# Patient Record
Sex: Female | Born: 1980 | Hispanic: No | Marital: Married | Smoking: Never smoker
Health system: Southern US, Community
[De-identification: ages and names within clinical notes are randomized; demographics above are authoritative.]

## PROBLEM LIST (undated history)

## (undated) DIAGNOSIS — F32A Depression, unspecified: Secondary | ICD-10-CM

## (undated) DIAGNOSIS — M1990 Unspecified osteoarthritis, unspecified site: Secondary | ICD-10-CM

## (undated) DIAGNOSIS — D649 Anemia, unspecified: Secondary | ICD-10-CM

## (undated) DIAGNOSIS — R519 Headache, unspecified: Secondary | ICD-10-CM

## (undated) DIAGNOSIS — E039 Hypothyroidism, unspecified: Secondary | ICD-10-CM

## (undated) DIAGNOSIS — K589 Irritable bowel syndrome without diarrhea: Secondary | ICD-10-CM

## (undated) DIAGNOSIS — F329 Major depressive disorder, single episode, unspecified: Secondary | ICD-10-CM

## (undated) DIAGNOSIS — R7989 Other specified abnormal findings of blood chemistry: Secondary | ICD-10-CM

## (undated) DIAGNOSIS — F419 Anxiety disorder, unspecified: Secondary | ICD-10-CM

## (undated) DIAGNOSIS — R945 Abnormal results of liver function studies: Secondary | ICD-10-CM

## (undated) DIAGNOSIS — G2581 Restless legs syndrome: Secondary | ICD-10-CM

## (undated) DIAGNOSIS — R1013 Epigastric pain: Secondary | ICD-10-CM

## (undated) DIAGNOSIS — N200 Calculus of kidney: Secondary | ICD-10-CM

## (undated) DIAGNOSIS — D219 Benign neoplasm of connective and other soft tissue, unspecified: Secondary | ICD-10-CM

## (undated) HISTORY — DX: Abnormal results of liver function studies: R94.5

## (undated) HISTORY — DX: Anxiety disorder, unspecified: F41.9

## (undated) HISTORY — DX: Benign neoplasm of connective and other soft tissue, unspecified: D21.9

## (undated) HISTORY — PX: FEMORAL HERNIA REPAIR: SUR1179

## (undated) HISTORY — DX: Major depressive disorder, single episode, unspecified: F32.9

## (undated) HISTORY — DX: Other specified abnormal findings of blood chemistry: R79.89

## (undated) HISTORY — DX: Restless legs syndrome: G25.81

## (undated) HISTORY — DX: Depression, unspecified: F32.A

## (undated) HISTORY — DX: Anemia, unspecified: D64.9

## (undated) HISTORY — DX: Headache, unspecified: R51.9

## (undated) HISTORY — DX: Unspecified osteoarthritis, unspecified site: M19.90

---

## 1998-11-01 ENCOUNTER — Emergency Department (HOSPITAL_COMMUNITY): Admission: EM | Admit: 1998-11-01 | Discharge: 1998-11-01 | Payer: Self-pay

## 1999-02-10 ENCOUNTER — Inpatient Hospital Stay (HOSPITAL_COMMUNITY): Admission: EM | Admit: 1999-02-10 | Discharge: 1999-02-12 | Payer: Self-pay | Admitting: *Deleted

## 2006-05-08 ENCOUNTER — Emergency Department (HOSPITAL_COMMUNITY): Admission: EM | Admit: 2006-05-08 | Discharge: 2006-05-08 | Payer: Self-pay | Admitting: Emergency Medicine

## 2007-04-18 ENCOUNTER — Emergency Department (HOSPITAL_COMMUNITY): Admission: EM | Admit: 2007-04-18 | Discharge: 2007-04-18 | Payer: Self-pay | Admitting: Emergency Medicine

## 2007-05-08 ENCOUNTER — Encounter: Admission: RE | Admit: 2007-05-08 | Discharge: 2007-05-08 | Payer: Self-pay | Admitting: Surgery

## 2007-09-13 ENCOUNTER — Ambulatory Visit (HOSPITAL_COMMUNITY): Admission: RE | Admit: 2007-09-13 | Discharge: 2007-09-13 | Payer: Self-pay | Admitting: Obstetrics and Gynecology

## 2008-02-01 HISTORY — PX: DIAGNOSTIC LAPAROSCOPY: SUR761

## 2008-06-17 ENCOUNTER — Emergency Department (HOSPITAL_COMMUNITY): Admission: EM | Admit: 2008-06-17 | Discharge: 2008-06-17 | Payer: Self-pay | Admitting: Emergency Medicine

## 2009-05-08 ENCOUNTER — Encounter: Admission: RE | Admit: 2009-05-08 | Discharge: 2009-05-08 | Payer: Self-pay | Admitting: Family Medicine

## 2009-10-12 ENCOUNTER — Encounter: Admission: RE | Admit: 2009-10-12 | Discharge: 2009-10-12 | Payer: Self-pay | Admitting: Family Medicine

## 2010-02-21 ENCOUNTER — Encounter: Payer: Self-pay | Admitting: Family Medicine

## 2010-06-15 NOTE — Op Note (Signed)
NAMESHALAYA, SWAILES NO.:  [id number]   MEDICAL RECORD NO.:  [id number]          PATIENT TYPE:  AMB   LOCATION:  SDC                           FACILITY:  [facility]   PHYSICIAN:  Leonie Man, M.D.   DATE OF BIRTH:  11-15-1980   DATE OF PROCEDURE:  09/13/2007  DATE OF DISCHARGE:  09/13/2007                               OPERATIVE REPORT   PREOPERATIVE DIAGNOSIS:  Right groin mass.   POSTOPERATIVE DIAGNOSIS:  Right femoral hernia.   PROCEDURE:  Right femoral herniorrhaphy with mesh.   SURGEON:  Leonie Man, M.D.   ASSISTANT:  Dois Davenport A. Rivard, M.D.   ANESTHESIA:  General.   INDICATIONS FOR PROCEDURE:  The patient is a 30 year old female referred  originally by Dr. Estanislado Pandy for a right-sided groin mass, which waxed and  waned in size with some pain.  CT scan showed this to be approximately a  3.8-cm mass extending into the femoral canal.  The patient comes to the  operating room now for possible diagnostic and therapeutic laparoscopy  for grade 4 endometriosis as well as for right-sided groin exploration.   The patient understands the risks and the benefits of the surgery and  gives her consent for surgery.   This was a surgical team approach with Dr. Estanislado Pandy.  Dr. Estanislado Pandy proceeded  first to do the laparoscopy and some adhesiolysis of the patient's  endometrial adhesions, and we took time to look at both the inguinal  canal, which appeared to be normal, and the region up this canal, there  was in fact a mass located in the preperitoneal area.  I then approached  the groin mass by making a tranverse incision in the lower groin crease  deepening this through the skin and subcutaneous tissue and dissecting  down to the mass.  As it turns out, the mass was in deed a femoral  hernia, which was dissected free and delivered up into the wound in its  entirety.  The hernial sac was then suture ligated at its base,  amputated, and discarded.  The hernial defect in the  femoral ring was  then repaired with a medium mesh plug of Polypropylene and was sutured  into the pecten pubis at the inguinal ligament and with the pubis with  interrupted 2-0 Vicryl sutures.  The repair itself was inspected from  within the abdomen by laparoscopy and noted to be intact.  There was no  mesh protruding into the peritoneal cavity.  The subcutaneous tissues  were then closed after the sponge and instrument counts were verified.  This was done with 3-0 Vicryl sutures.  The skin was then closed  with an intradermal suture of 4-0 Monocryl and then reinforced with  Steri-Strips.  Sterile dressing was applied.  The anesthetic was  reversed.  The patient was then moved from the operating room to  recovery room having tolerated the procedure well.      Leonie Man, M.D.  Electronically Signed     PB/MEDQ  D:  10/01/2007  T:  10/02/2007  Job:  [id number]   cc:   Dois Davenport A.  Rivard, M.D.  Fax: [phone]

## 2010-06-15 NOTE — Op Note (Signed)
[name] [name], [name]               ACCOUNT NO.:  [id number]   MEDICAL RECORD NO.:  [id number]          PATIENT TYPE:  AMB   LOCATION:  SDC                           [facility]:  [facility]   PHYSICIAN:  Dois Davenport A. Rivard, M.D. DATE OF BIRTH:  06/07/1980   DATE OF PROCEDURE:  09/13/2007  DATE OF DISCHARGE:                               OPERATIVE REPORT   PREOPERATIVE DIAGNOSES:  1. Pelvic pain.  2. Right groin mass.   POSTOPERATIVE DIAGNOSES:  1. Grade 4 endometriosis.  2. Right femoral hernia.   PROCEDURE:  Diagnostic laparoscopy, Dr. Estanislado Pandy and right femoral  herniorrhaphy, Dr. Talmage Nap.   ASSISTANTS:  Dr. Talmage Nap and Dr. Estanislado Pandy.   ANESTHESIA:  General, Dr. Jean Rosenthal.   ESTIMATED BLOOD LOSS:  Minimal.   DESCRIPTION OF PROCEDURE:  After being informed of the planned procedure  with possible complications including bleeding, infection, injury to  bowel, bladder or ureters, need for laparotomy, informed consent was  obtained.  The patient was taken to OR #7, given general anesthesia with  endotracheal intubation without any complication.  She was placed in a  lithotomy position, prepped and draped in a sterile fashion, and a Foley  catheter was inserted in her bladder.  A speculum was inserted anterior  lip of the cervix, was grasped with a tenaculum forceps, and a  intrauterine manipulator was placed without difficulty.  Please note  that the pelvic exam previously revealed an anteverted uterus with some  limited mobility and no mass was felt.   We infiltrate the umbilical area with 5 mL of Marcaine 0.25 and  performed a semi-elliptical incision which was brought down sharply to  the fascia.  The fascia was identified with Kocher forceps and incised  with Mayo scissors and peritoneum was entered bluntly.  A pursestring  suture of 0 Vicryl was placed on the fascia and a 10-mm Hasson trocar  was easily inserted, held in place with a pursestring suture.  This  allows easy insufflation of a  pneumoperitoneum with CO2 at a maximum  pressure of 15 mmHg.   Laparoscope was inserted.   A 5-mm suprapubic trocar was placed under direct visualization after  infiltrating with Marcaine 0.25.   Observation:  Anterior cul-de-sac was normal.  Uterus shows a small  fibroid measuring 1 cm at the fundus.  The posterior cul-de-sac appears  free of disease; but in the mid posterior body of the uterus, there are  dense grade 4 adhesions with endometriotic bluish lesions pulling the  posterior retroperitoneum to the fundus, immobilizing the latter.  The  left tube is tortuous and held in place through the left pelvic wall  with dense adhesions.  The left ovary was attached to the left pelvic  wall with dense grade 4 adhesions and upon attempt to mobilize a little  bit of chocolate discharge, appears compatible with endometrioma.  The  right tube and the right ovary are completely plicated to the right  pelvic wall with grade 4 adhesion, which also pulls on the right cornua  of the uterus.  The ureter was visualized on the right and appears  to be  diving into that adhesion process.  Initially, thought was that we could  try to free the fundus of the uterus and so a left lower quadrant trocar  was inserted.  A 5-mm trocar was inserted under direct visualization  after infiltrating with Marcaine 0.25 and using hot scissors, attempt to  perform lysis of adhesions very close to the uterine serosa was  unsuccessful and deemed too risky due to the fact that the patient had  not good prep bowel wise.  Although we do not see bowel in that process,  there is the right lateral aspect of the rectum which was pulled by the  process and that lysis of adhesions would risk bowel injury.   Decision was made to stop the lysis of adhesion there, cauterized the  few sites of oozing, irrigated the pelvis with warm saline and noted a  satisfactory hemostasis, and discontinued the laparoscopic attempt of  lysis  of adhesion at this time.   The patient will be offered preparation with Depo-Lupron as well as a  bowel preparation and should she wish to pursue surgical venue, we will  plan this under robotic approach for enhanced precision.   Dr. Talmage Nap now proceeds with his right femoral herniorrhaphy, which will  be dictated by him in which I assisted.   After the herniorrhaphy was completed then we had visualized  intraperitoneally its closure.  Trocars were removed after evacuating  the pneumoperitoneum.  The fascia of the umbilical incision was closed  with a previously placed pursestring suture of 0 Vicryl and skin of all  incision was closed with subcuticular suture of 3-0 Monocryl and  Dermabond.   Instruments and sponge count was complete x2.  Estimated blood loss is  minimal.  The procedure was very well tolerated by the patient who was  taken to recovery room in a well and stable condition.  No specimen from  the laparoscopy was sent.      Crist Fat Rivard, M.D.  Electronically Signed     SAR/MEDQ  D:  09/13/2007  T:  09/14/2007  Job:  [id number]

## 2010-06-18 DIAGNOSIS — F329 Major depressive disorder, single episode, unspecified: Secondary | ICD-10-CM

## 2010-07-30 DIAGNOSIS — F329 Major depressive disorder, single episode, unspecified: Secondary | ICD-10-CM

## 2010-10-25 LAB — DIFFERENTIAL
Eosinophils Relative: 2
Monocytes Relative: 6
Neutro Abs: 4.3
Neutrophils Relative %: 71

## 2010-10-25 LAB — URINALYSIS, ROUTINE W REFLEX MICROSCOPIC
Specific Gravity, Urine: 1.015
Urobilinogen, UA: 0.2
pH: 8

## 2010-10-25 LAB — CBC
HCT: 36.6
Hemoglobin: 12.4
MCHC: 34
MCV: 85.1
RBC: 4.3
WBC: 6

## 2010-10-29 LAB — PREGNANCY, URINE: Preg Test, Ur: NEGATIVE

## 2010-10-29 LAB — CBC
HCT: 36.7
MCHC: 33.3
MCV: 86.7
RBC: 4.23

## 2011-01-15 ENCOUNTER — Encounter (HOSPITAL_COMMUNITY): Payer: Self-pay | Admitting: *Deleted

## 2011-01-15 ENCOUNTER — Inpatient Hospital Stay (HOSPITAL_COMMUNITY): Payer: BC Managed Care – PPO

## 2011-01-15 ENCOUNTER — Inpatient Hospital Stay (HOSPITAL_COMMUNITY)
Admission: AD | Admit: 2011-01-15 | Discharge: 2011-01-15 | Disposition: A | Discharge status: Home / Self Care | Payer: BC Managed Care – PPO | Source: Ambulatory Visit | Attending: Gynecology | Admitting: Gynecology

## 2011-01-15 DIAGNOSIS — N809 Endometriosis, unspecified: Secondary | ICD-10-CM

## 2011-01-15 DIAGNOSIS — D259 Leiomyoma of uterus, unspecified: Secondary | ICD-10-CM | POA: Insufficient documentation

## 2011-01-15 DIAGNOSIS — R1030 Lower abdominal pain, unspecified: Secondary | ICD-10-CM

## 2011-01-15 DIAGNOSIS — R109 Unspecified abdominal pain: Secondary | ICD-10-CM | POA: Insufficient documentation

## 2011-01-15 LAB — WET PREP, GENITAL
Trich, Wet Prep: NONE SEEN
WBC, Wet Prep HPF POC: NONE SEEN
Yeast Wet Prep HPF POC: NONE SEEN

## 2011-01-15 LAB — URINE MICROSCOPIC-ADD ON

## 2011-01-15 LAB — URINALYSIS, ROUTINE W REFLEX MICROSCOPIC
Ketones, ur: NEGATIVE mg/dL
Nitrite: NEGATIVE
Protein, ur: 30 mg/dL — AB
Specific Gravity, Urine: 1.025 (ref 1.005–1.030)
Urobilinogen, UA: 0.2 mg/dL (ref 0.0–1.0)
pH: 5.5 (ref 5.0–8.0)

## 2011-01-15 MED ORDER — TRAMADOL HCL ER 100 MG PO TB24: 100.0000 mg | ORAL_TABLET | Freq: Every day | ORAL | Status: AC

## 2011-01-15 MED ORDER — KETOROLAC TROMETHAMINE 60 MG/2ML IM SOLN
60.0000 mg | Freq: Once | INTRAMUSCULAR | Status: AC
  Administered 2011-01-15: 60 mg via INTRAMUSCULAR
  Filled 2011-01-15: qty 2

## 2011-01-15 MED ORDER — IBUPROFEN 600 MG PO TABS: 600.0000 mg | ORAL_TABLET | Freq: Four times a day (QID) | ORAL | Status: AC | PRN

## 2011-01-15 NOTE — Progress Notes (Signed)
Pt states, " I had spotting for a couple of days and then started my period yesterday. I had some pain yesterday and I atook advil, but today it is worse, and it is in my left side of my abdomen  and down my legs and goes into my ribs.  I had a laparoscopic 2 yrs ago and was told that have endometriosis."

## 2011-01-15 NOTE — ED Provider Notes (Signed)
History     Chief Complaint  Patient presents with  . Abdominal Pain  . Vaginal Bleeding   HPI [name] [name] 30 y.o. Comes to [facility]U tonight with severe lower abdominal pain.  Saw Dr. Nicholas Lose in the office on 12-30-10.  Menses started 01-13-11 and she has had severe pain this time.  Taking Ibuprofen and Midol but has not helped pain today.   OB History    Grav Para Term Preterm Abortions TAB SAB Ect Mult Living   0               Past Medical History  Diagnosis Date  . Endometriosis     Past Surgical History  Procedure Date  . Laproscopic 2010    Family History  Problem Relation Age of Onset  . Anesthesia problems Neg Hx   . Hypotension Neg Hx   . Pseudochol deficiency Neg Hx   . Malignant hyperthermia Neg Hx     History  Substance Use Topics  . Smoking status: Not on file  . Smokeless tobacco: Not on file  . Alcohol Use:     Allergies:  Allergies  Allergen Reactions  . Penicillins Other (See Comments)    Childhood allergy;reaction unknown    Prescriptions prior to admission  Medication Sig Dispense Refill  . BuPROPion HCl (WELLBUTRIN PO) Take 1 tablet by mouth daily.        Marland Kitchen escitalopram (LEXAPRO) 10 MG tablet Take 10 mg by mouth daily.        Marland Kitchen ibuprofen (ADVIL,MOTRIN) 200 MG tablet Take 200 mg by mouth every 6 (six) hours as needed. For pain         Review of Systems  Gastrointestinal: Positive for abdominal pain. Negative for nausea and vomiting.   Physical Exam   Blood pressure 119/75, pulse 62, temperature 98.5 F (36.9 C), temperature source Oral, resp. rate 20, height 5' 4.5" (1.638 m), weight 130 lb 8 oz (59.194 kg), last menstrual period 01/13/2011.  Physical Exam  Nursing note and vitals reviewed. Constitutional: She is oriented to person, place, and time. She appears well-developed and well-nourished.       Client seems in severe pain.  Rubbing right leg repetitively as pain is radiating to right leg an to right ribs  HENT:  Head:  Normocephalic.  Eyes: EOM are normal.  Neck: Neck supple.  GI: Soft. There is tenderness. There is no rebound and no guarding.       Tender all across lower abdomen  Genitourinary:       Speculum exam: Vagina - Mod amount of bloody discharge, no odor Cervix - No contact bleeding Bimanual exam: Cervix closed Uterus tender, normal size Adnexa tender R>L, no masses bilaterally GC/Chlam, wet prep done Chaperone present for exam.  Musculoskeletal: Normal range of motion.  Neurological: She is alert and oriented to person, place, and time.  Skin: Skin is warm and dry.  Psychiatric: She has a normal mood and affect.    MAU Course  Procedures Toradol 60 mg IM given for pain and client is much more comfortable after ultrasound.  Last pain medication was 8 am on 01-14-11. Results for orders placed during the hospital encounter of 01/15/11 (from the past 24 hour(s))  URINALYSIS, ROUTINE W REFLEX MICROSCOPIC     Status: Abnormal   Collection Time   01/15/11  1:55 AM      Component Value Range   Color, Urine RED (*) YELLOW    APPearance CLOUDY (*) CLEAR  Specific Gravity, Urine 1.025  1.005 - 1.030    pH 5.5  5.0 - 8.0    Glucose, UA NEGATIVE  NEGATIVE (mg/dL)   Hgb urine dipstick LARGE (*) NEGATIVE    Bilirubin Urine NEGATIVE  NEGATIVE    Ketones, ur NEGATIVE  NEGATIVE (mg/dL)   Protein, ur 30 (*) NEGATIVE (mg/dL)   Urobilinogen, UA 0.2  0.0 - 1.0 (mg/dL)   Nitrite NEGATIVE  NEGATIVE    Leukocytes, UA TRACE (*) NEGATIVE   URINE MICROSCOPIC-ADD ON     Status: Normal   Collection Time   01/15/11  1:55 AM      Component Value Range   Squamous Epithelial / LPF RARE  RARE    WBC, UA 0-2  <3 (WBC/hpf)   RBC / HPF TOO NUMEROUS TO COUNT  <3 (RBC/hpf)   Bacteria, UA RARE  RARE   POCT PREGNANCY, URINE     Status: Normal   Collection Time   01/15/11  2:42 AM      Component Value Range   Preg Test, Ur NEGATIVE    WET PREP, GENITAL     Status: Abnormal   Collection Time   01/15/11   3:04 AM      Component Value Range   Yeast, Wet Prep NONE SEEN  NONE SEEN    Trich, Wet Prep NONE SEEN  NONE SEEN    Clue Cells, Wet Prep FEW (*) NONE SEEN    WBC, Wet Prep HPF POC NONE SEEN  NONE SEEN     MDM Clinical Data: Pelvic / abdominal pain, history of endometriosis.  TRANSABDOMINAL AND TRANSVAGINAL ULTRASOUND OF PELVIS  Technique: Both transabdominal and transvaginal ultrasound  examinations of the pelvis were performed. Transabdominal technique  was performed for global imaging of the pelvis including uterus,  ovaries, adnexal regions, and pelvic cul-de-sac.  Comparison: None.  It was necessary to proceed with endovaginal exam following the  transabdominal exam to visualize the endometrium and adnexa.  Findings:  Uterus: Numerous fibroids are present, measuring up to 2.0 x 1.5  cm. The uterus measures 9.4 x 5.2 x 7.4 cm. The fibroids are  predominately intramural. There is a subserosal fibroid as well.  No submucosal fibroid identified.  Endometrium: Normal in thickness and appearance, measuring 8 mm.  Right ovary: Normal appearance/no adnexal mass. Measures 2.8 x  1.3 x 1.9 cm.  Left ovary: Normal appearance/no adnexal mass. Measures 3.1 x 1.8  x 1.9 cm.  Other findings: No free fluid  IMPRESSION:  Fibroid uterus. No acute abnormality identified.    Assessment and Plan  Uterine fibroids  Plan It will be important to take your pain medication on time if you are having cramping Follow up with dr. Nicholas Lose if your pain continues. Will prescribe PO ibuprofen and ultram for severe pain   [name] [name] 01/15/2011, 3:11 AM   [name] Bernheim, NP 01/15/11 [phone]

## 2011-01-18 LAB — GC/CHLAMYDIA PROBE AMP, GENITAL
Chlamydia, DNA Probe: NEGATIVE
GC Probe Amp, Genital: NEGATIVE

## 2011-03-15 ENCOUNTER — Encounter: Payer: Self-pay | Admitting: Internal Medicine

## 2011-03-29 ENCOUNTER — Other Ambulatory Visit: Payer: Self-pay | Admitting: Orthopedic Surgery

## 2011-03-29 DIAGNOSIS — M545 Low back pain, unspecified: Secondary | ICD-10-CM

## 2011-04-08 ENCOUNTER — Other Ambulatory Visit: Payer: BC Managed Care – PPO

## 2011-04-08 ENCOUNTER — Ambulatory Visit
Admission: RE | Admit: 2011-04-08 | Discharge: 2011-04-08 | Disposition: A | Discharge status: Home / Self Care | Payer: BC Managed Care – PPO | Source: Ambulatory Visit | Attending: Orthopedic Surgery | Admitting: Orthopedic Surgery

## 2011-04-08 DIAGNOSIS — M545 Low back pain, unspecified: Secondary | ICD-10-CM

## 2011-04-08 MED ORDER — IOHEXOL 300 MG/ML  SOLN
100.0000 mL | Freq: Once | INTRAMUSCULAR | Status: AC | PRN
  Administered 2011-04-08: 100 mL via INTRAVENOUS

## 2011-04-26 ENCOUNTER — Encounter: Payer: Self-pay | Admitting: *Deleted

## 2011-05-10 ENCOUNTER — Encounter: Payer: Self-pay | Admitting: Internal Medicine

## 2011-05-10 ENCOUNTER — Ambulatory Visit (INDEPENDENT_AMBULATORY_CARE_PROVIDER_SITE_OTHER): Payer: BC Managed Care – PPO | Admitting: Internal Medicine

## 2011-05-10 VITALS — BP 100/60 | HR 74 | Ht 66.0 in | Wt 125.0 lb

## 2011-05-10 DIAGNOSIS — N809 Endometriosis, unspecified: Secondary | ICD-10-CM

## 2011-05-10 DIAGNOSIS — R1031 Right lower quadrant pain: Secondary | ICD-10-CM

## 2011-05-10 MED ORDER — PEG-KCL-NACL-NASULF-NA ASC-C 100 G PO SOLR: 1.0000 | Freq: Once | ORAL | Status: DC

## 2011-05-10 NOTE — Progress Notes (Signed)
[name] [name] May 17, 1980 MRN [id number]    History of Present Illness:  This is a 31 year old female from [place] with a 4 year history of lower abdominal pain which occurs only during her periods once a month and lasts about 4 days. She had severe right lower quadrant abdominal pain yesterday and last night. There has been no association of the pain with bowel habits or with food. The pain is not positional. It sometimes extends to the right thigh. She has a bowel movement every other day. She has a history of endometriosis diagnosed on a laparoscopy in [place], [place] in 2009. Some of the endometriosis was ablated. She was on birth control pills for several years but discontinued it about 2 years ago. She has had a recent evaluation by Dr. Nicholas Lose with pelvic ultrasound which shows several intramural uterine fibroids. CT scan of the abdomen in February 2013 again showed uterine fibroids, normal hips and lumbosacral spine. An upper abdominal ultrasound in September 2011 was normal. She has a family history of colon cancer in her father who had a premalignant polyp at age 72 and died of colon cancer at age 33.   Past Medical History  Diagnosis Date  . Endometriosis   . Fibroids     uterine  . Elevated LFTs   . Depression   . Restless leg syndrome   . Anemia   . Anxiety    Past Surgical History  Procedure Date  . Diagnostic laparoscopy 2010  . Femoral hernia repair     right    reports that she has never smoked. She has never used smokeless tobacco. She reports that she does not drink alcohol or use illicit drugs. family history includes Colon cancer in her father and Colon polyps in her father.  There is no history of Anesthesia problems, and Hypotension, and Pseudochol deficiency, and Malignant hyperthermia, . Allergies  Allergen Reactions  . Penicillins Other (See Comments)    Childhood allergy;reaction unknown        Review of Systems: Denies nausea, vomiting, abdominal  distention, diarrhea or rectal bleeding  The remainder of the 10 point ROS is negative except as outlined in H&P   Physical Exam: General appearance  Well developed, in no distress. Eyes- non icteric. HEENT nontraumatic, normocephalic. Mouth no lesions, tongue papillated, no cheilosis. Neck supple without adenopathy, thyroid not enlarged, no carotid bruits, no JVD. Lungs Clear to auscultation bilaterally. Cor normal S1, normal S2, regular rhythm, no murmur,  quiet precordium. Abdomen: Soft relaxed with normal active bowel sounds. Minimal tenderness in right lower quadrant overlying cecum. No palpable mass. Straight leg raising is negative, sitting up and leaning down does not precipitate the pain. There is no evidence of inguinal hernia. Rectal: Small amount of solid Hemoccult-positive stool. Patient is on her menstrual cycle. Extremities no pedal edema. Skin no lesions. Neurological alert and oriented x 3. Psychological normal mood and affect.  Assessment and Plan:  Problem #1 Chronic right lower quadrant abdominal pain present only during her menses. I suspect recurrent endometriosis. Imaging studies have been negative. She had a prior laparoscopy in 2009. Birth control pills did not seem to help significantly. She has a strong family history of colorectal cancer in her father and therefore colonoscopy would be justified at this point to clear the possibility of a cecal lesion. We will schedule her for a colonoscopy and if it is negative, I would suggest she go back to Dr. Nicholas Lose to consider a repeat laparoscop or possibly hormonal manipulation.  I have discussed this plan with the patient.  05/10/2011 [name]

## 2011-05-10 NOTE — Patient Instructions (Addendum)
You have been scheduled for a colonoscopy with propofol. Please follow written instructions given to you at your visit today.  Please pick up your prep kit at the pharmacy within the next 1-3 days. CC: Dr Merri Brunette, Dr Beather Arbour

## 2011-07-06 ENCOUNTER — Encounter: Payer: Self-pay | Admitting: Internal Medicine

## 2011-07-06 ENCOUNTER — Ambulatory Visit (AMBULATORY_SURGERY_CENTER): Payer: BC Managed Care – PPO | Admitting: Internal Medicine

## 2011-07-06 VITALS — BP 104/65 | HR 50 | Temp 98.6°F | Resp 13 | Ht 66.0 in | Wt 125.0 lb

## 2011-07-06 DIAGNOSIS — R1031 Right lower quadrant pain: Secondary | ICD-10-CM

## 2011-07-06 DIAGNOSIS — Z1211 Encounter for screening for malignant neoplasm of colon: Secondary | ICD-10-CM

## 2011-07-06 DIAGNOSIS — Z800 Family history of malignant neoplasm of digestive organs: Secondary | ICD-10-CM

## 2011-07-06 DIAGNOSIS — G8929 Other chronic pain: Secondary | ICD-10-CM

## 2011-07-06 MED ORDER — SODIUM CHLORIDE 0.9 % IV SOLN: 500.0000 mL | INTRAVENOUS | Status: DC

## 2011-07-06 NOTE — Patient Instructions (Signed)
You may resume your prior medications today.  Please call if any questions or concerns.  Handout given to your care partner on a high fiber diet.  Per Dr. Juanda Chance follow up with Dr. Nicholas Lose for consideration of laparoscopic exam for endometriosis.      YOU HAD AN ENDOSCOPIC PROCEDURE TODAY AT THE [facility]: Refer to the procedure report that was given to you for any specific questions about what was found during the examination.  If the procedure report does not answer your questions, please call your gastroenterologist to clarify.  If you requested that your care partner not be given the details of your procedure findings, then the procedure report has been included in a sealed envelope for you to review at your convenience later.  YOU SHOULD EXPECT: Some feelings of bloating in the abdomen. Passage of more gas than usual.  Walking can help get rid of the air that was put into your GI tract during the procedure and reduce the bloating. If you had a lower endoscopy (such as a colonoscopy or flexible sigmoidoscopy) you may notice spotting of blood in your stool or on the toilet paper. If you underwent a bowel prep for your procedure, then you may not have a normal bowel movement for a few days.  DIET: Your first meal following the procedure should be a light meal and then it is ok to progress to your normal diet.  A half-sandwich or bowl of soup is an example of a good first meal.  Heavy or fried foods are harder to digest and may make you feel nauseous or bloated.  Likewise meals heavy in dairy and vegetables can cause extra gas to form and this can also increase the bloating.  Drink plenty of fluids but you should avoid alcoholic beverages for 24 hours.  ACTIVITY: Your care partner should take you home directly after the procedure.  You should plan to take it easy, moving slowly for the rest of the day.  You can resume normal activity the day after the procedure however you should NOT DRIVE  or use heavy machinery for 24 hours (because of the sedation medicines used during the test).    SYMPTOMS TO REPORT IMMEDIATELY: A gastroenterologist can be reached at any hour.  During normal business hours, 8:30 AM to 5:00 PM Monday through Friday, call [phone].  After hours and on weekends, please call the GI answering service at [phone] who will take a message and have the physician on call contact you.   Following lower endoscopy (colonoscopy or flexible sigmoidoscopy):  Excessive amounts of blood in the stool  Significant tenderness or worsening of abdominal pains  Swelling of the abdomen that is new, acute  Fever of 100F or higher    FOLLOW UP: If any biopsies were taken you will be contacted by phone or by letter within the next 1-3 weeks.  Call your gastroenterologist if you have not heard about the biopsies in 3 weeks.  Our staff will call the home number listed on your records the next business day following your procedure to check on you and address any questions or concerns that you may have at that time regarding the information given to you following your procedure. This is a courtesy call and so if there is no answer at the home number and we have not heard from you through the emergency physician on call, we will assume that you have returned to your regular daily activities without incident.  SIGNATURES/CONFIDENTIALITY: You and/or your care partner have signed paperwork which will be entered into your electronic medical record.  These signatures attest to the fact that that the information above on your After Visit Summary has been reviewed and is understood.  Full responsibility of the confidentiality of this discharge information lies with you and/or your care-partner.

## 2011-07-06 NOTE — Op Note (Signed)
[facility] [address] [place], [place]  [address]  COLONOSCOPY PROCEDURE REPORT  PATIENT:  [name] [name], [name] [name]  MR#:  [id number] BIRTHDATE:  08/21/1980, 30 yrs. old  GENDER:  female ENDOSCOPIST:  Hedwig Morton. Juanda Chance, MD REF. BY:  Merri Brunette, M.D.  Dr Beather Arbour PROCEDURE DATE:  07/06/2011 PROCEDURE:  Colonoscopy 19147 ASA CLASS:  Class I INDICATIONS:  Abdominal pain, family history of colon cancer RLQ abd. pain, hx of endometriosis, father with colon cancer age 68 MEDICATIONS:   MAC sedation, administered by CRNA, propofol (Diprivan) 200 mg  DESCRIPTION OF PROCEDURE:   After the risks and benefits and of the procedure were explained, informed consent was obtained. Digital rectal exam was performed and revealed no rectal masses. The LB [id number]-[id number] endoscope was introduced through the anus and advanced to the cecum, which was identified by both the appendix and ileocecal valve.  The quality of the prep was good, using MoviPrep.  The instrument was then slowly withdrawn as the colon was fully examined. <<PROCEDUREIMAGES>>  FINDINGS:  No polyps or cancers were seen (see image1, image2, image3, and image4).   Retroflexed views in the rectum revealed no abnormalities.    The scope was then withdrawn from the patient and the procedure completed.  COMPLICATIONS:  None ENDOSCOPIC IMPRESSION: 1) No polyps or cancers 2) Normal colonoscopy redundant tortuous colon, nothing to account for the RLQ abd. pain RECOMMENDATIONS: 1) High fiber diet. I suggest that she follows up with Dr Nicholas Lose for consideration of laparoscopic exam for endometriosis  REPEAT EXAM:  In 5 year(s) for.  ______________________________ Hedwig Morton. Juanda Chance, MD  CC:  n. eSIGNED:   Hedwig Morton. Ambar Raphael at 07/06/2011 08:45 AM  Dena Billet, [phone]

## 2011-07-06 NOTE — Progress Notes (Signed)
Patient did not experience any of the following events: a burn prior to discharge; a fall within the facility; wrong site/side/patient/procedure/implant event; or a hospital transfer or hospital admission upon discharge from the facility. (G8907) Patient did not have preoperative order for IV antibiotic SSI prophylaxis. (G8918)  

## 2011-07-06 NOTE — Progress Notes (Signed)
No complaints noted in the recovery room. maw 

## 2011-07-07 ENCOUNTER — Telehealth: Payer: Self-pay

## 2011-07-07 NOTE — Telephone Encounter (Signed)
  Follow up Call-  Call back number 07/06/2011  Post procedure Call Back phone  # [phone]  Permission to leave phone message Yes     Patient questions:  Do you have a fever, pain , or abdominal swelling? no Pain Score  0 *  Have you tolerated food without any problems? yes  Have you been able to return to your normal activities? yes  Do you have any questions about your discharge instructions: Diet   no Medications  no Follow up visit  no  Do you have questions or concerns about your Care? no  Actions: * If pain score is 4 or above: No action needed, pain <4.

## 2011-09-09 IMAGING — US US SOFT TISSUE HEAD/NECK
1 series · 14 of 22 positions shown · non-contrast
Comparison: None

CLINICAL DATA: Thyroid nodule

THYROID ULTRASOUND
TECHNIQUE: Ultrasound examination of the thyroid gland and
adjacent soft tissues was performed.

[Series 1: us soft tissue head/neck · 0.05mm/px · 14 of 22 slices shown]
[im 1/22]
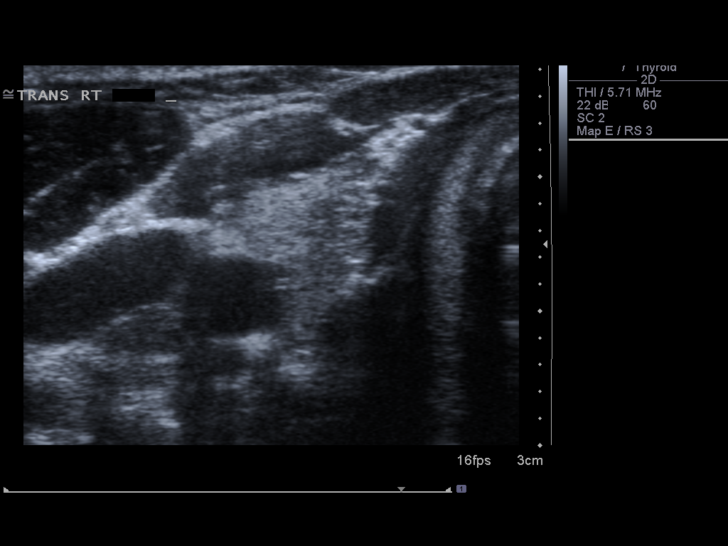
[im 3/22]
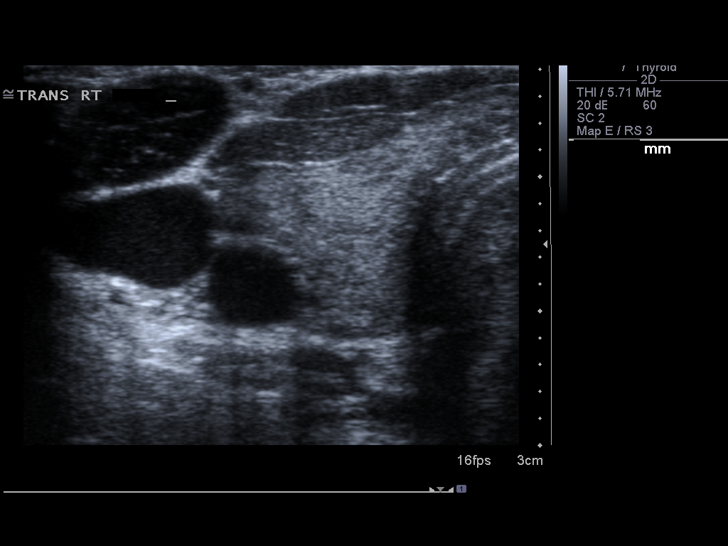
[im 4/22]
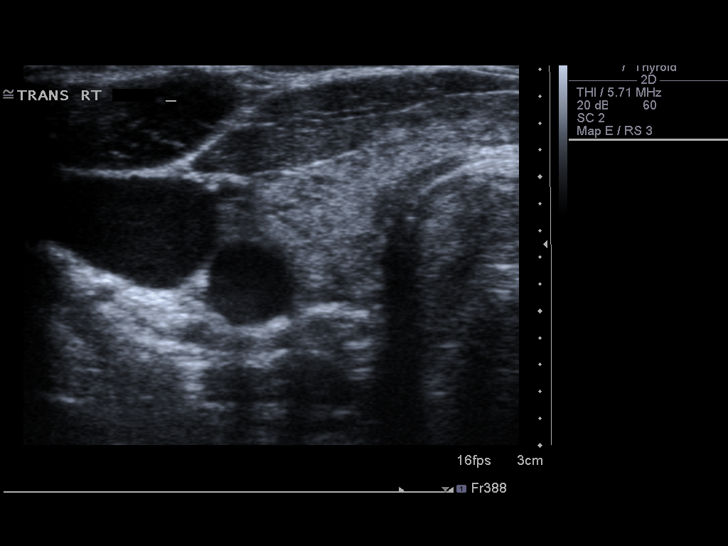
[im 6/22]
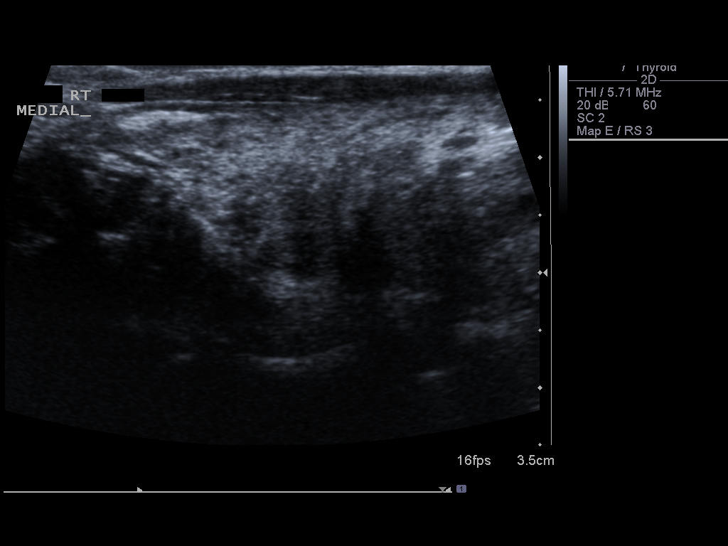
[im 8/22]
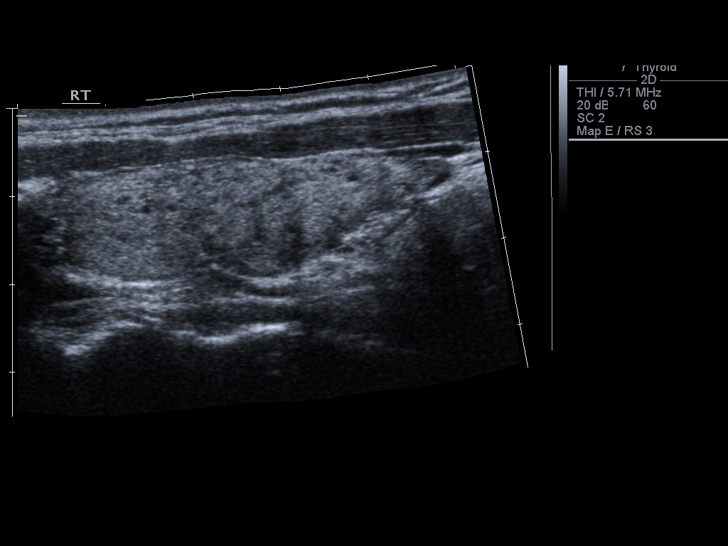
[im 9/22]
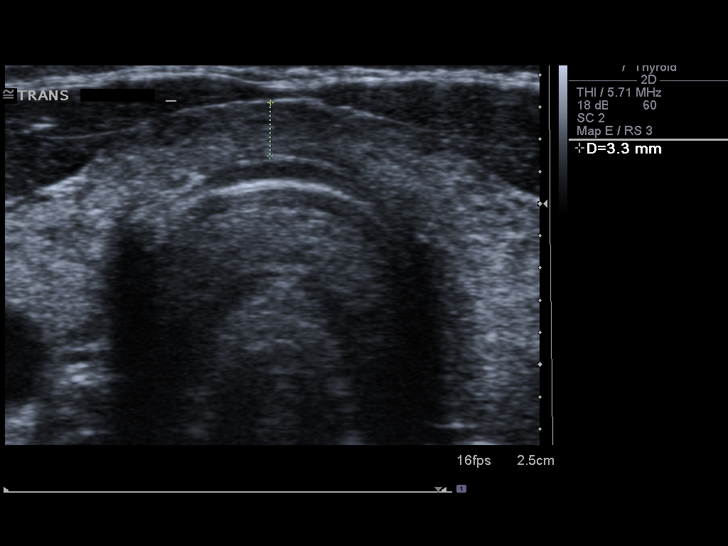
[im 11/22]
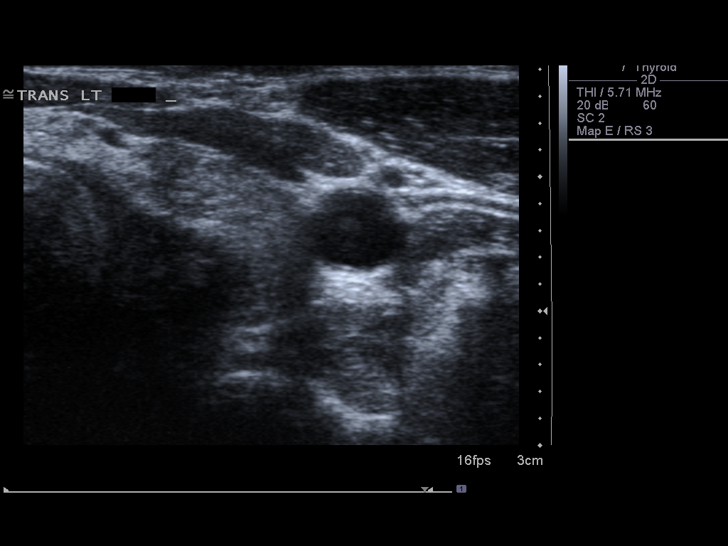
[im 12/22]
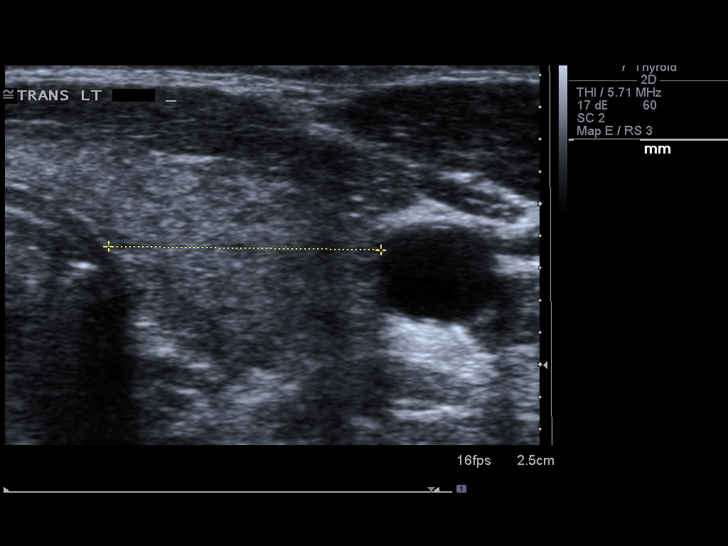
[im 14/22]
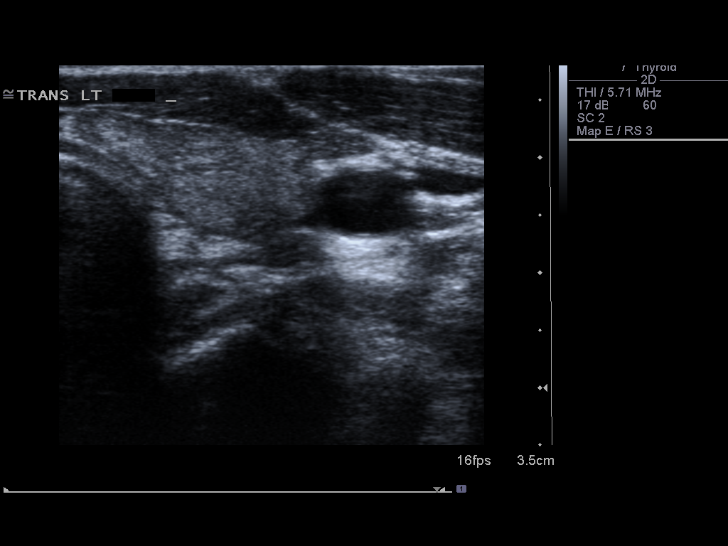
[im 15/22]
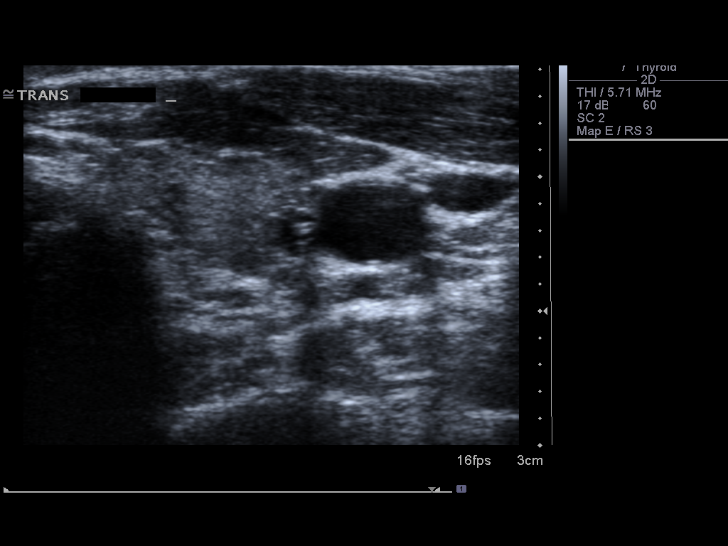
[im 17/22]
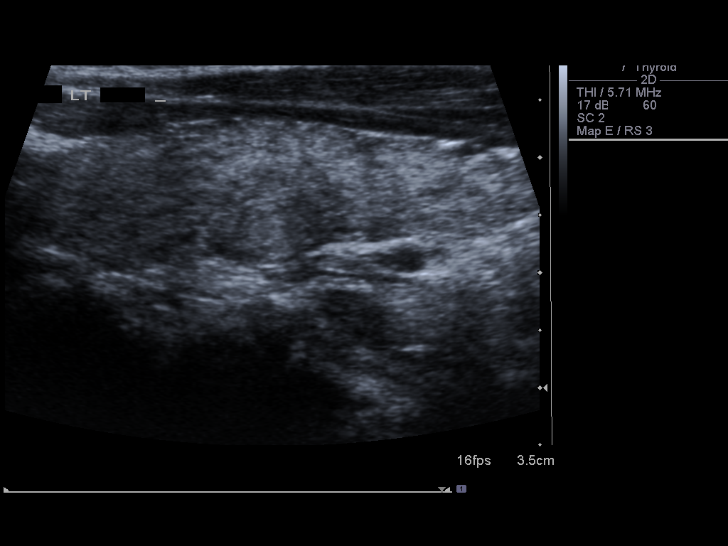
[im 19/22]
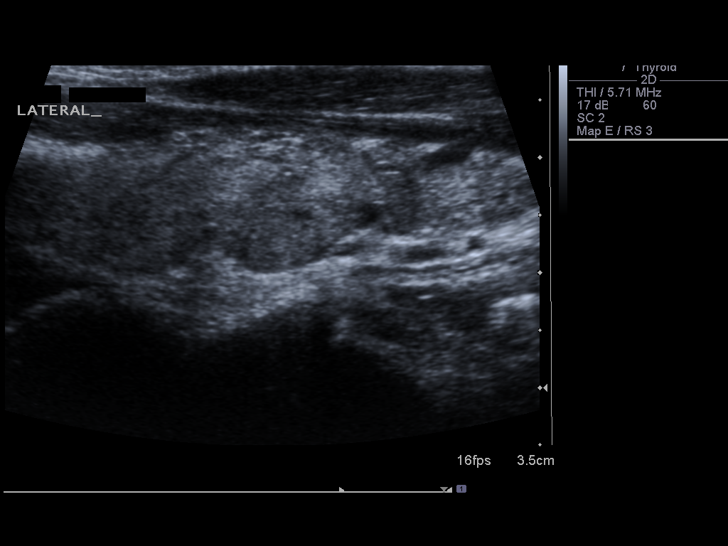
[im 20/22]
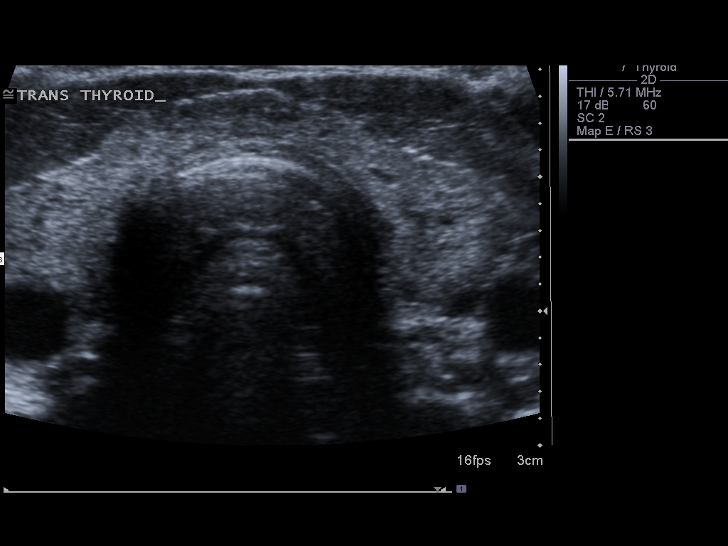
[im 22/22]
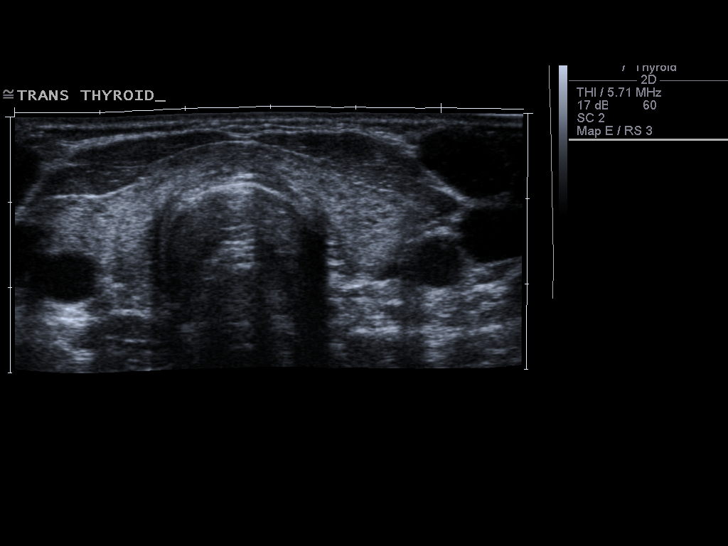

[14 of 22 positions shown; findings below may reference images not displayed]

FINDINGS: The right lobe of the thyroid gland measures 4.9 x 1.4 x
1.5 cm.

The left lobe of the thyroid gland measures 4.7 x 1.4 x 1.7 cm.

No focal thyroid lesions identified.

The echotexture of the thyroid gland is slightly heterogeneous.
IMPRESSION: 1.  No thyroid nodules.
2.  The thyroid echotexture is slightly heterogeneous but otherwise
normal.

## 2012-04-10 ENCOUNTER — Telehealth: Payer: Self-pay | Admitting: Internal Medicine

## 2012-04-10 NOTE — Telephone Encounter (Signed)
Please send  Bentyl 10 mg po tid, #30, 1 refill and purchase Probiotic 1 po qd before she sees me.

## 2012-04-10 NOTE — Telephone Encounter (Signed)
Patient c/o bloating, gas, lower abdominal pain x 1 week. States it is not like the pain she was seen for last year. Pain is all across her stomach, below belly button. States her stomach is rumbling. Offered OV with extender but patient only wants to see Dr. Juanda Chance. Scheduled on 04/25/12 at 2:30 PM.

## 2012-04-11 MED ORDER — DICYCLOMINE HCL 10 MG PO CAPS: ORAL_CAPSULE | ORAL | Status: DC

## 2012-04-11 NOTE — Telephone Encounter (Signed)
Spoke with patient and gave her Dr. Brodie's recommendations. Rx sent to pharmacy. 

## 2012-04-25 ENCOUNTER — Other Ambulatory Visit (INDEPENDENT_AMBULATORY_CARE_PROVIDER_SITE_OTHER): Payer: BC Managed Care – PPO

## 2012-04-25 ENCOUNTER — Ambulatory Visit (INDEPENDENT_AMBULATORY_CARE_PROVIDER_SITE_OTHER): Payer: BC Managed Care – PPO | Admitting: Internal Medicine

## 2012-04-25 ENCOUNTER — Encounter: Payer: Self-pay | Admitting: Internal Medicine

## 2012-04-25 VITALS — BP 100/60 | HR 56 | Ht 66.0 in | Wt 141.0 lb

## 2012-04-25 DIAGNOSIS — K3189 Other diseases of stomach and duodenum: Secondary | ICD-10-CM

## 2012-04-25 DIAGNOSIS — K589 Irritable bowel syndrome without diarrhea: Secondary | ICD-10-CM

## 2012-04-25 DIAGNOSIS — R1013 Epigastric pain: Secondary | ICD-10-CM

## 2012-04-25 LAB — SEDIMENTATION RATE: Sed Rate: 20 mm/hr (ref 0–22)

## 2012-04-25 MED ORDER — OMEPRAZOLE 20 MG PO CPDR: 20.0000 mg | DELAYED_RELEASE_CAPSULE | Freq: Every day | ORAL | Status: DC

## 2012-04-25 NOTE — Patient Instructions (Addendum)
Your physician has requested that you go to the basement for the following lab work before leaving today: Celiac panel, IgA, Sed Rate  We have sent the following medications to your pharmacy for you to pick up at your convenience: Prilosec  CC: Dr Merri Brunette

## 2012-04-25 NOTE — Progress Notes (Signed)
[name] [name] May 21, 1980 MRN [id number]  History of Present Illness:  This is a 32 year old female from [place] with the bloating, flatulence and pelvic pain and hx of endometriosis. We saw her in April 2013 for lower abdominal discomfort and she had a normal colonoscopy with findings of a redundant tortuous colon at that time. The lower abdominal discomfort still continuous and is usually worse with her periods. She has new symptoms of bloating, food intolerance and upper abdominal discomfort. A CT scan of the abdomen in February 2013 showed a uterine fibroid but no active process. An upper abdominal ultrasound in September 2011 was normal. She is unable to tolerate spicy foods. She denies heartburn or dysphagia.   Past Medical History  Diagnosis Date  . Endometriosis   . Fibroids     uterine  . Elevated LFTs   . Depression   . Restless leg syndrome   . Anemia   . Anxiety    Past Surgical History  Procedure Laterality Date  . Diagnostic laparoscopy  2010  . Femoral hernia repair      right    reports that she has never smoked. She has never used smokeless tobacco. She reports that she does not drink alcohol or use illicit drugs. family history includes Colon cancer in her father and Colon polyps in her father.  There is no history of Anesthesia problems, and Hypotension, and Pseudochol deficiency, and Malignant hyperthermia, . Allergies  Allergen Reactions  . Penicillins Other (See Comments)    Childhood allergy;reaction unknown        Review of Systems:  The remainder of the 10 point ROS is negative except as outlined in H&P   Physical Exam: General appearance  Well developed, in no distress. Eyes- non icteric. HEENT nontraumatic, normocephalic. Mouth no lesions, tongue papillated, no cheilosis. Neck supple without adenopathy, thyroid not enlarged, no carotid bruits, no JVD. Lungs Clear to auscultation bilaterally. Cor normal S1, normal S2, regular rhythm, no  murmur,  quiet precordium. Abdomen: Soft abdomen with normal active bowel sounds tenderness in the epigastrium. Lower abdomen is unremarkable. Rectal: Not done. Extremities no pedal edema. Skin no lesions. Neurological alert and oriented x 3. Psychological normal mood and affect.  Assessment and Plan:  Problem #34 32 year old, female with dyspepsia, food intolerance and bloating who had a normal upper abdominal ultrasound within the last 3 years. We will give her a trial of Prilosec 20 mg daily and she will continue on the probiotic. We will check her sprue profile, sedimentation rate and IgA level today. She will followup on an as necessary basis. Problem #2 Positive family hx of colon cancer in her father, s/p recent colonoscopy, recall in 2018   04/25/2012 [name] [name]

## 2012-04-26 LAB — CELIAC PANEL 10
Gliadin IgA: 6.4 U/mL (ref <20)
Gliadin IgG: 5.6 U/mL (ref <20)
IgA: 224 mg/dL (ref 69–380)
Tissue Transglut Ab: 4.2 U/mL (ref <20)

## 2012-06-06 ENCOUNTER — Ambulatory Visit (HOSPITAL_COMMUNITY)
Admission: RE | Admit: 2012-06-06 | Discharge: 2012-06-06 | Disposition: A | Discharge status: Home / Self Care | Payer: BC Managed Care – PPO | Source: Ambulatory Visit | Attending: Obstetrics and Gynecology | Admitting: Obstetrics and Gynecology

## 2012-06-06 ENCOUNTER — Other Ambulatory Visit (HOSPITAL_COMMUNITY): Payer: Self-pay | Admitting: Obstetrics and Gynecology

## 2012-06-06 DIAGNOSIS — R062 Wheezing: Secondary | ICD-10-CM

## 2012-06-06 DIAGNOSIS — J3489 Other specified disorders of nose and nasal sinuses: Secondary | ICD-10-CM | POA: Insufficient documentation

## 2012-08-29 ENCOUNTER — Other Ambulatory Visit: Payer: Self-pay | Admitting: Internal Medicine

## 2012-09-17 ENCOUNTER — Other Ambulatory Visit: Payer: Self-pay | Admitting: Family Medicine

## 2012-09-17 ENCOUNTER — Ambulatory Visit
Admission: RE | Admit: 2012-09-17 | Discharge: 2012-09-17 | Disposition: A | Discharge status: Home / Self Care | Payer: BC Managed Care – PPO | Source: Ambulatory Visit | Attending: Family Medicine | Admitting: Family Medicine

## 2012-09-17 MED ORDER — IOHEXOL 300 MG/ML  SOLN
100.0000 mL | Freq: Once | INTRAMUSCULAR | Status: AC | PRN
  Administered 2012-09-17: 100 mL via INTRAVENOUS

## 2012-09-17 MED ORDER — IOHEXOL 300 MG/ML  SOLN
40.0000 mL | Freq: Once | INTRAMUSCULAR | Status: AC | PRN
  Administered 2012-09-17: 40 mL via ORAL

## 2012-11-05 ENCOUNTER — Other Ambulatory Visit (HOSPITAL_COMMUNITY): Payer: Self-pay | Admitting: Gynecology

## 2012-11-05 DIAGNOSIS — Z3141 Encounter for fertility testing: Secondary | ICD-10-CM

## 2012-11-08 ENCOUNTER — Ambulatory Visit (HOSPITAL_COMMUNITY)
Admission: RE | Admit: 2012-11-08 | Discharge: 2012-11-08 | Disposition: A | Discharge status: Home / Self Care | Payer: BC Managed Care – PPO | Source: Ambulatory Visit | Attending: Gynecology | Admitting: Gynecology

## 2012-11-08 ENCOUNTER — Ambulatory Visit (HOSPITAL_COMMUNITY): Admission: RE | Admit: 2012-11-08 | Payer: BC Managed Care – PPO | Source: Ambulatory Visit

## 2012-11-08 ENCOUNTER — Encounter (INDEPENDENT_AMBULATORY_CARE_PROVIDER_SITE_OTHER): Payer: Self-pay

## 2012-11-08 DIAGNOSIS — N979 Female infertility, unspecified: Secondary | ICD-10-CM | POA: Insufficient documentation

## 2012-11-08 DIAGNOSIS — Z3141 Encounter for fertility testing: Secondary | ICD-10-CM

## 2012-11-08 MED ORDER — IOHEXOL 300 MG/ML  SOLN
10.0000 mL | Freq: Once | INTRAMUSCULAR | Status: AC | PRN
  Administered 2012-11-08: 10 mL

## 2013-01-31 ENCOUNTER — Emergency Department (HOSPITAL_COMMUNITY)
Admission: EM | Admit: 2013-01-31 | Discharge: 2013-02-01 | Disposition: A | Discharge status: Home / Self Care | Payer: BC Managed Care – PPO | Attending: Emergency Medicine | Admitting: Emergency Medicine

## 2013-01-31 ENCOUNTER — Encounter (HOSPITAL_COMMUNITY): Payer: Self-pay | Admitting: Emergency Medicine

## 2013-01-31 ENCOUNTER — Emergency Department (HOSPITAL_COMMUNITY): Payer: BC Managed Care – PPO

## 2013-01-31 DIAGNOSIS — IMO0001 Reserved for inherently not codable concepts without codable children: Secondary | ICD-10-CM | POA: Insufficient documentation

## 2013-01-31 DIAGNOSIS — Z862 Personal history of diseases of the blood and blood-forming organs and certain disorders involving the immune mechanism: Secondary | ICD-10-CM | POA: Insufficient documentation

## 2013-01-31 DIAGNOSIS — R197 Diarrhea, unspecified: Secondary | ICD-10-CM | POA: Insufficient documentation

## 2013-01-31 DIAGNOSIS — Z8742 Personal history of other diseases of the female genital tract: Secondary | ICD-10-CM | POA: Insufficient documentation

## 2013-01-31 DIAGNOSIS — Z79899 Other long term (current) drug therapy: Secondary | ICD-10-CM | POA: Insufficient documentation

## 2013-01-31 DIAGNOSIS — J111 Influenza due to unidentified influenza virus with other respiratory manifestations: Secondary | ICD-10-CM | POA: Insufficient documentation

## 2013-01-31 DIAGNOSIS — N12 Tubulo-interstitial nephritis, not specified as acute or chronic: Secondary | ICD-10-CM | POA: Insufficient documentation

## 2013-01-31 DIAGNOSIS — R5383 Other fatigue: Secondary | ICD-10-CM

## 2013-01-31 DIAGNOSIS — Z8669 Personal history of other diseases of the nervous system and sense organs: Secondary | ICD-10-CM | POA: Insufficient documentation

## 2013-01-31 DIAGNOSIS — R5381 Other malaise: Secondary | ICD-10-CM | POA: Insufficient documentation

## 2013-01-31 DIAGNOSIS — Z880 Allergy status to penicillin: Secondary | ICD-10-CM | POA: Insufficient documentation

## 2013-01-31 DIAGNOSIS — F411 Generalized anxiety disorder: Secondary | ICD-10-CM | POA: Insufficient documentation

## 2013-01-31 DIAGNOSIS — Z3202 Encounter for pregnancy test, result negative: Secondary | ICD-10-CM | POA: Insufficient documentation

## 2013-01-31 DIAGNOSIS — F329 Major depressive disorder, single episode, unspecified: Secondary | ICD-10-CM | POA: Insufficient documentation

## 2013-01-31 DIAGNOSIS — F3289 Other specified depressive episodes: Secondary | ICD-10-CM | POA: Insufficient documentation

## 2013-01-31 LAB — CBC WITH DIFFERENTIAL/PLATELET
Basophils Absolute: 0 10*3/uL (ref 0.0–0.1)
Basophils Relative: 0 % (ref 0–1)
EOS ABS: 0 10*3/uL (ref 0.0–0.7)
EOS PCT: 0 % (ref 0–5)
HCT: 34.4 % — ABNORMAL LOW (ref 36.0–46.0)
HEMOGLOBIN: 11.4 g/dL — AB (ref 12.0–15.0)
LYMPHS ABS: 0.7 10*3/uL (ref 0.7–4.0)
Lymphocytes Relative: 5 % — ABNORMAL LOW (ref 12–46)
MCH: 28.3 pg (ref 26.0–34.0)
MCHC: 33.1 g/dL (ref 30.0–36.0)
MCV: 85.4 fL (ref 78.0–100.0)
MONO ABS: 0.9 10*3/uL (ref 0.1–1.0)
Monocytes Relative: 7 % (ref 3–12)
NEUTROS PCT: 88 % — AB (ref 43–77)
Neutro Abs: 11.6 10*3/uL — ABNORMAL HIGH (ref 1.7–7.7)
Platelets: 125 10*3/uL — ABNORMAL LOW (ref 150–400)
RBC: 4.03 MIL/uL (ref 3.87–5.11)
RDW: 14.3 % (ref 11.5–15.5)
WBC Morphology: INCREASED
WBC: 13.2 10*3/uL — ABNORMAL HIGH (ref 4.0–10.5)

## 2013-01-31 LAB — URINE MICROSCOPIC-ADD ON

## 2013-01-31 LAB — COMPREHENSIVE METABOLIC PANEL
ALT: 13 U/L (ref 0–35)
AST: 20 U/L (ref 0–37)
Albumin: 3.1 g/dL — ABNORMAL LOW (ref 3.5–5.2)
Alkaline Phosphatase: 65 U/L (ref 39–117)
BUN: 15 mg/dL (ref 6–23)
CALCIUM: 8.4 mg/dL (ref 8.4–10.5)
CO2: 25 meq/L (ref 19–32)
CREATININE: 0.99 mg/dL (ref 0.50–1.10)
Chloride: 102 mEq/L (ref 96–112)
GFR calc non Af Amer: 74 mL/min — ABNORMAL LOW (ref >90)
GFR, EST AFRICAN AMERICAN: 85 mL/min — AB (ref >90)
GLUCOSE: 111 mg/dL — AB (ref 70–99)
Potassium: 3.5 mEq/L — ABNORMAL LOW (ref 3.7–5.3)
SODIUM: 139 meq/L (ref 137–147)
TOTAL PROTEIN: 6.7 g/dL (ref 6.0–8.3)
Total Bilirubin: 0.4 mg/dL (ref 0.3–1.2)

## 2013-01-31 LAB — URINALYSIS, ROUTINE W REFLEX MICROSCOPIC
BILIRUBIN URINE: NEGATIVE
Glucose, UA: NEGATIVE mg/dL
KETONES UR: NEGATIVE mg/dL
NITRITE: NEGATIVE
PROTEIN: 30 mg/dL — AB
Specific Gravity, Urine: 1.011 (ref 1.005–1.030)
Urobilinogen, UA: 1 mg/dL (ref 0.0–1.0)
pH: 6.5 (ref 5.0–8.0)

## 2013-01-31 LAB — PREGNANCY, URINE: Preg Test, Ur: NEGATIVE

## 2013-01-31 MED ORDER — ONDANSETRON 4 MG PO TBDP
8.0000 mg | ORAL_TABLET | Freq: Once | ORAL | Status: AC
  Administered 2013-01-31: 8 mg via ORAL
  Filled 2013-01-31: qty 2

## 2013-01-31 MED ORDER — HYDROCODONE-ACETAMINOPHEN 5-325 MG PO TABS
2.0000 | ORAL_TABLET | Freq: Once | ORAL | Status: AC
Start: 2013-01-31 — End: 2013-01-31
  Administered 2013-01-31: 2 via ORAL
  Filled 2013-01-31: qty 2

## 2013-01-31 MED ORDER — DEXTROSE 5 % IV SOLN
1.0000 g | Freq: Once | INTRAVENOUS | Status: AC
  Administered 2013-01-31: 1 g via INTRAVENOUS
  Filled 2013-01-31: qty 10

## 2013-01-31 MED ORDER — SODIUM CHLORIDE 0.9 % IV BOLUS (SEPSIS)
1000.0000 mL | Freq: Once | INTRAVENOUS | Status: AC
  Administered 2013-01-31: 1000 mL via INTRAVENOUS

## 2013-01-31 MED ORDER — ACETAMINOPHEN 325 MG PO TABS
650.0000 mg | ORAL_TABLET | Freq: Once | ORAL | Status: AC
  Administered 2013-01-31: 650 mg via ORAL

## 2013-01-31 NOTE — ED Notes (Signed)
MD aware of blood pressure, Bolus of fluid ns started

## 2013-01-31 NOTE — ED Notes (Signed)
The pt is c/o hurting all over her abd her chest her flank with nv  Headache diarrhea.  lmp  Dec 24th.  The pt recently diagnosed with a kidney stone with [facility] physicians

## 2013-01-31 NOTE — ED Notes (Signed)
She also has a cold and coughing

## 2013-01-31 NOTE — ED Notes (Signed)
Pt c/o right sided pain in her abdomen

## 2013-01-31 NOTE — ED Notes (Signed)
Pt had CT scan performed in October that discovered a kidney stone on her right side, Pt went to [facility] yesterday and rec'd pain injections that helped relieve the pts pain, but the patient presents to the ER today with increased pain.

## 2013-01-31 NOTE — ED Provider Notes (Signed)
CSN: [id number]     Arrival date & time 01/31/13  2014 History   First MD Initiated Contact with Patient 01/31/13 2205     Chief Complaint  Patient presents with  . multiple complaints    (Consider location/radiation/quality/duration/timing/severity/associated sxs/prior Treatment) HPI Comments: Patient presents today with multiple complaints.  She reports that she has been having a cough, fever, chills, body aches, congestion, and fatigue one week ago.  Her family has similar symptoms.  She reports that two days ago she began having pain in her right flank area and was also having dysuria, urgency, and increased urinary frequency.  Pain has been constant since that time, but she reports that she only has pain when she touches that area.  Pain does not radiate.  She describes the pain as an ache.  She denies hematuria.  She also reports that yesterday she began having nausea, vomiting, and diarrhea.  No blood in her emesis or blood in her stool.  She has not taken anything for any of her symptoms.  She reports that she is otherwise healthy.  The history is provided by the patient.    Past Medical History  Diagnosis Date  . Endometriosis   . Fibroids     uterine  . Elevated LFTs   . Depression   . Restless leg syndrome   . Anemia   . Anxiety    Past Surgical History  Procedure Laterality Date  . Diagnostic laparoscopy  2010  . Femoral hernia repair      right   Family History  Problem Relation Age of Onset  . Anesthesia problems Neg Hx   . Hypotension Neg Hx   . Pseudochol deficiency Neg Hx   . Malignant hyperthermia Neg Hx   . Colon cancer Father   . Colon polyps Father    History  Substance Use Topics  . Smoking status: Never Smoker   . Smokeless tobacco: Never Used  . Alcohol Use: No   OB History   Grav Para Term Preterm Abortions TAB SAB Ect Mult Living   0              Review of Systems  All other systems reviewed and are negative.    Allergies   Penicillins  Home Medications   Current Outpatient Rx  Name  Route  Sig  Dispense  Refill  . escitalopram (LEXAPRO) 10 MG tablet   Oral   Take 10 mg by mouth daily.           Marland Kitchen levothyroxine (SYNTHROID, LEVOTHROID) 100 MCG tablet   Oral   Take 100 mcg by mouth daily before breakfast.         . omeprazole (PRILOSEC) 20 MG capsule      Take one capsule by mouth one time daily   30 capsule   3   . Probiotic Product (PROBIOTIC DAILY PO)   Oral   Take 1 tablet by mouth daily.          BP 93/59  Pulse 83  Temp(Src) 98 F (36.7 C) (Oral)  Resp 16  Wt 143 lb 9 oz (65.12 kg)  SpO2 96%  LMP 01/23/2013 Physical Exam  Nursing note and vitals reviewed. Constitutional: She appears well-developed and well-nourished.  HENT:  Head: Normocephalic and atraumatic.  Mouth/Throat: Oropharynx is clear and moist.  Neck: Normal range of motion. Neck supple.  Cardiovascular: Normal rate, regular rhythm and normal heart sounds.   Pulmonary/Chest: Effort normal and breath sounds normal.  No respiratory distress. She has no wheezes. She has no rales.  Abdominal: Soft. Normal appearance and bowel sounds are normal. She exhibits no distension and no mass. There is no tenderness. There is CVA tenderness. There is no rebound and no guarding.  Right CVA tenderness  Musculoskeletal: Normal range of motion.  Neurological: She is alert.  Skin: Skin is warm and dry.  Psychiatric: She has a normal mood and affect.    ED Course  Procedures (including critical care time) Labs Review Labs Reviewed  URINALYSIS, ROUTINE W REFLEX MICROSCOPIC - Abnormal; Notable for the following:    APPearance CLOUDY (*)    Hgb urine dipstick MODERATE (*)    Protein, ur 30 (*)    Leukocytes, UA MODERATE (*)    All other components within normal limits  URINE MICROSCOPIC-ADD ON - Abnormal; Notable for the following:    Squamous Epithelial / LPF MANY (*)    Bacteria, UA MANY (*)    All other components within  normal limits  URINE CULTURE  PREGNANCY, URINE  CBC WITH DIFFERENTIAL  COMPREHENSIVE METABOLIC PANEL   Imaging Review Dg Chest 2 View  01/31/2013   CLINICAL DATA:  Cough, fever, chills, flu-like symptoms for 1 week.  EXAM: CHEST  2 VIEW  COMPARISON:  06/06/2012  FINDINGS: The heart size and mediastinal contours are within normal limits. Both lungs are clear. The visualized skeletal structures are unremarkable.  IMPRESSION: No active cardiopulmonary disease.   Electronically Signed   By: Lucienne Capers M.D.   On: 01/31/2013 23:26    EKG Interpretation   None       MDM  No diagnosis found. Patient with symptoms consistent with influenza.   No signs of dehydration, tolerating PO's.  Lungs are clear.  Discussed the cost versus benefit of Tamiflu treatment with the patient.  The patient understands that symptoms are greater than the recommended 24-48 hour window of treatment.  Patient will be discharged with instructions to orally hydrate, rest, and use over-the-counter medications such as anti-inflammatories ibuprofen and Aleve for muscle aches and Tylenol for fever.  Patient also with urinary symptoms and flank pain.  UA showing infection.  Patient given IV Rocephin while in the ED and discharged home with antibiotics.  Labs unremarkable.  Patient stable for discharge.  Return precautions given.     Hyman Bible, PA-C 02/02/13 2238

## 2013-02-01 MED ORDER — CEPHALEXIN 500 MG PO CAPS: 500.0000 mg | ORAL_CAPSULE | Freq: Four times a day (QID) | ORAL | Status: DC

## 2013-02-01 MED ORDER — ONDANSETRON 4 MG PO TBDP: 4.0000 mg | ORAL_TABLET | Freq: Three times a day (TID) | ORAL | Status: DC | PRN

## 2013-02-02 LAB — URINE CULTURE

## 2013-02-02 NOTE — ED Provider Notes (Signed)
Medical screening examination/treatment/procedure(s) were performed by non-physician practitioner and as supervising physician I was immediately available for consultation/collaboration.  EKG Interpretation   None         Wandra Arthurs, MD 02/02/13 2257

## 2013-02-03 ENCOUNTER — Encounter (HOSPITAL_BASED_OUTPATIENT_CLINIC_OR_DEPARTMENT_OTHER): Payer: Self-pay | Admitting: Emergency Medicine

## 2013-02-03 ENCOUNTER — Telehealth (HOSPITAL_COMMUNITY): Payer: Self-pay | Admitting: Emergency Medicine

## 2013-02-03 ENCOUNTER — Emergency Department (HOSPITAL_BASED_OUTPATIENT_CLINIC_OR_DEPARTMENT_OTHER)
Admission: EM | Admit: 2013-02-03 | Discharge: 2013-02-03 | Disposition: A | Discharge status: Home / Self Care | Payer: BC Managed Care – PPO | Attending: Emergency Medicine | Admitting: Emergency Medicine

## 2013-02-03 ENCOUNTER — Emergency Department (HOSPITAL_BASED_OUTPATIENT_CLINIC_OR_DEPARTMENT_OTHER): Payer: BC Managed Care – PPO

## 2013-02-03 DIAGNOSIS — Z8669 Personal history of other diseases of the nervous system and sense organs: Secondary | ICD-10-CM | POA: Insufficient documentation

## 2013-02-03 DIAGNOSIS — F3289 Other specified depressive episodes: Secondary | ICD-10-CM | POA: Insufficient documentation

## 2013-02-03 DIAGNOSIS — Z862 Personal history of diseases of the blood and blood-forming organs and certain disorders involving the immune mechanism: Secondary | ICD-10-CM | POA: Insufficient documentation

## 2013-02-03 DIAGNOSIS — Z792 Long term (current) use of antibiotics: Secondary | ICD-10-CM | POA: Insufficient documentation

## 2013-02-03 DIAGNOSIS — Z880 Allergy status to penicillin: Secondary | ICD-10-CM | POA: Insufficient documentation

## 2013-02-03 DIAGNOSIS — F329 Major depressive disorder, single episode, unspecified: Secondary | ICD-10-CM | POA: Insufficient documentation

## 2013-02-03 DIAGNOSIS — Z8742 Personal history of other diseases of the female genital tract: Secondary | ICD-10-CM | POA: Insufficient documentation

## 2013-02-03 DIAGNOSIS — Z87442 Personal history of urinary calculi: Secondary | ICD-10-CM | POA: Insufficient documentation

## 2013-02-03 DIAGNOSIS — F411 Generalized anxiety disorder: Secondary | ICD-10-CM | POA: Insufficient documentation

## 2013-02-03 DIAGNOSIS — J111 Influenza due to unidentified influenza virus with other respiratory manifestations: Secondary | ICD-10-CM

## 2013-02-03 DIAGNOSIS — Z3202 Encounter for pregnancy test, result negative: Secondary | ICD-10-CM | POA: Insufficient documentation

## 2013-02-03 DIAGNOSIS — Z79899 Other long term (current) drug therapy: Secondary | ICD-10-CM | POA: Insufficient documentation

## 2013-02-03 DIAGNOSIS — N12 Tubulo-interstitial nephritis, not specified as acute or chronic: Secondary | ICD-10-CM | POA: Insufficient documentation

## 2013-02-03 HISTORY — DX: Calculus of kidney: N20.0

## 2013-02-03 LAB — CBC WITH DIFFERENTIAL/PLATELET
BASOS PCT: 0 % (ref 0–1)
Basophils Absolute: 0 10*3/uL (ref 0.0–0.1)
Eosinophils Absolute: 0.1 10*3/uL (ref 0.0–0.7)
Eosinophils Relative: 2 % (ref 0–5)
HCT: 33 % — ABNORMAL LOW (ref 36.0–46.0)
HEMOGLOBIN: 10.5 g/dL — AB (ref 12.0–15.0)
Lymphocytes Relative: 21 % (ref 12–46)
Lymphs Abs: 0.8 10*3/uL (ref 0.7–4.0)
MCH: 27.3 pg (ref 26.0–34.0)
MCHC: 31.8 g/dL (ref 30.0–36.0)
MCV: 85.9 fL (ref 78.0–100.0)
MONOS PCT: 13 % — AB (ref 3–12)
Monocytes Absolute: 0.5 10*3/uL (ref 0.1–1.0)
NEUTROS ABS: 2.4 10*3/uL (ref 1.7–7.7)
Neutrophils Relative %: 64 % (ref 43–77)
Platelets: 190 10*3/uL (ref 150–400)
RBC: 3.84 MIL/uL — AB (ref 3.87–5.11)
RDW: 14.8 % (ref 11.5–15.5)
WBC: 3.8 10*3/uL — ABNORMAL LOW (ref 4.0–10.5)

## 2013-02-03 LAB — COMPREHENSIVE METABOLIC PANEL
ALBUMIN: 2.9 g/dL — AB (ref 3.5–5.2)
ALK PHOS: 78 U/L (ref 39–117)
ALT: 14 U/L (ref 0–35)
AST: 23 U/L (ref 0–37)
BILIRUBIN TOTAL: 0.3 mg/dL (ref 0.3–1.2)
BUN: 10 mg/dL (ref 6–23)
CO2: 27 mEq/L (ref 19–32)
CREATININE: 0.9 mg/dL (ref 0.50–1.10)
Calcium: 8.9 mg/dL (ref 8.4–10.5)
Chloride: 102 mEq/L (ref 96–112)
GFR calc Af Amer: >90 mL/min (ref >90)
GFR calc non Af Amer: 82 mL/min — ABNORMAL LOW (ref >90)
GLUCOSE: 95 mg/dL (ref 70–99)
POTASSIUM: 3.6 meq/L — AB (ref 3.7–5.3)
Sodium: 140 mEq/L (ref 137–147)
Total Protein: 7.1 g/dL (ref 6.0–8.3)

## 2013-02-03 LAB — URINALYSIS, ROUTINE W REFLEX MICROSCOPIC
BILIRUBIN URINE: NEGATIVE
Glucose, UA: NEGATIVE mg/dL
KETONES UR: NEGATIVE mg/dL
NITRITE: NEGATIVE
PH: 7 (ref 5.0–8.0)
PROTEIN: NEGATIVE mg/dL
Specific Gravity, Urine: 1.012 (ref 1.005–1.030)
Urobilinogen, UA: 0.2 mg/dL (ref 0.0–1.0)

## 2013-02-03 LAB — URINE MICROSCOPIC-ADD ON

## 2013-02-03 LAB — LIPASE, BLOOD: LIPASE: 12 U/L (ref 11–59)

## 2013-02-03 LAB — PREGNANCY, URINE: Preg Test, Ur: NEGATIVE

## 2013-02-03 MED ORDER — HYDROCODONE-ACETAMINOPHEN 5-325 MG PO TABS: 2.0000 | ORAL_TABLET | ORAL | Status: DC | PRN

## 2013-02-03 MED ORDER — SODIUM CHLORIDE 0.9 % IV SOLN
INTRAVENOUS | Status: DC
  Administered 2013-02-03: 15:00:00 via INTRAVENOUS

## 2013-02-03 MED ORDER — LEVOFLOXACIN 750 MG PO TABS: 750.0000 mg | ORAL_TABLET | Freq: Every day | ORAL | Status: DC

## 2013-02-03 MED ORDER — IOHEXOL 300 MG/ML  SOLN
50.0000 mL | Freq: Once | INTRAMUSCULAR | Status: AC | PRN
  Administered 2013-02-03: 50 mL via ORAL

## 2013-02-03 MED ORDER — IOHEXOL 300 MG/ML  SOLN
100.0000 mL | Freq: Once | INTRAMUSCULAR | Status: AC | PRN
  Administered 2013-02-03: 100 mL via INTRAVENOUS

## 2013-02-03 NOTE — ED Notes (Signed)
Post ED Visit - Positive Culture Follow-up  Culture report reviewed by antimicrobial stewardship pharmacist: [x]  Wes [place], Pharm.D., BCPS []  Heide Guile, Pharm.D., BCPS []  Alycia Rossetti, Pharm.D., BCPS []  [place], [place].D., BCPS, AAHIVP []  Legrand Como, Pharm.D., BCPS, AAHIVP  Positive urine culture Treated with Keflex, organism sensitive to the same and no further patient follow-up is required at this time.  Harrison, Rex Kras 02/03/2013, 6:18 PM

## 2013-02-03 NOTE — ED Provider Notes (Signed)
CSN: [id number]     Arrival date & time 02/03/13  1224 History   First MD Initiated Contact with Patient 02/03/13 1348     Chief Complaint  Patient presents with  . Abdominal Pain   (Consider location/radiation/quality/duration/timing/severity/associated sxs/prior Treatment) Patient is a 33 y.o. female presenting with abdominal pain. The history is provided by the patient.  Abdominal Pain Pain location:  Suprapubic, RLQ and RUQ Pain quality: aching   Pain radiates to:  Does not radiate Pain severity:  Moderate Onset quality:  Gradual Duration:  1 week Timing:  Constant Progression:  Worsening Context: recent illness   Relieved by:  Nothing Worsened by:  Nothing tried Associated symptoms: chills, cough, dysuria, fever, nausea, sore throat and vomiting   Associated symptoms: no diarrhea, no vaginal bleeding and no vaginal discharge    [name] [name] is a 33 y.o. female who presents to the ED with abdominal pain that started 01/30/2013. She went to the ED at [facility] Jan. 1st and was treated for her flu like symptoms and for her UTI. The pain in the abdomen has continued so [facility] and they told her to come to the ED for CT scan. She continues to have fever and the abdominal pain is worse. The pain started in the epigastric area and now radiates to the right upper and right lower quadrants. Currently taking antibiotics for UTI.   Past Medical History  Diagnosis Date  . Endometriosis   . Fibroids     uterine  . Elevated LFTs   . Depression   . Restless leg syndrome   . Anemia   . Anxiety   . Kidney stone    Past Surgical History  Procedure Laterality Date  . Diagnostic laparoscopy  2010  . Femoral hernia repair      right   Family History  Problem Relation Age of Onset  . Anesthesia problems Neg Hx   . Hypotension Neg Hx   . Pseudochol deficiency Neg Hx   . Malignant hyperthermia Neg Hx   . Colon cancer Father   . Colon polyps Father     History  Substance Use Topics  . Smoking status: Never Smoker   . Smokeless tobacco: Never Used  . Alcohol Use: No   OB History   Grav Para Term Preterm Abortions TAB SAB Ect Mult Living   0              Review of Systems  Constitutional: Positive for fever and chills.  HENT: Positive for sore throat.   Eyes: Negative for visual disturbance.  Respiratory: Positive for cough.   Gastrointestinal: Positive for nausea, vomiting and abdominal pain. Negative for diarrhea.  Genitourinary: Positive for dysuria. Negative for vaginal bleeding and vaginal discharge.  Musculoskeletal: Positive for back pain.  Skin: Negative for rash.  Neurological: Negative for headaches.  Psychiatric/Behavioral: Negative for confusion. The patient is not nervous/anxious.     Allergies  Penicillins  Home Medications   Current Outpatient Rx  Name  Route  Sig  Dispense  Refill  . cephALEXin (KEFLEX) 500 MG capsule   Oral   Take 1 capsule (500 mg total) by mouth 4 (four) times daily.   40 capsule   0   . escitalopram (LEXAPRO) 10 MG tablet   Oral   Take 10 mg by mouth daily.           Marland Kitchen levothyroxine (SYNTHROID, LEVOTHROID) 100 MCG tablet   Oral  Take 100 mcg by mouth daily before breakfast.         . omeprazole (PRILOSEC) 20 MG capsule      Take one capsule by mouth one time daily   30 capsule   3   . ondansetron (ZOFRAN ODT) 4 MG disintegrating tablet   Oral   Take 1 tablet (4 mg total) by mouth every 8 (eight) hours as needed for nausea or vomiting.   20 tablet   0   . Probiotic Product (PROBIOTIC DAILY PO)   Oral   Take 1 tablet by mouth daily.          BP 109/86  Pulse 78  Temp(Src) 99.1 F (37.3 C) (Oral)  Resp 18  Ht 5\' 6"  (1.676 m)  Wt 145 lb (65.772 kg)  BMI 23.41 kg/m2  SpO2 97%  LMP 01/23/2013 Physical Exam  Nursing note and vitals reviewed. Constitutional: She is oriented to person, place, and time. She appears well-developed and well-nourished.   HENT:  Head: Normocephalic and atraumatic.  Eyes: Conjunctivae and EOM are normal.  Neck: Normal range of motion. Neck supple.  Cardiovascular: Normal rate and regular rhythm.   Pulmonary/Chest: Effort normal. She has no wheezes. She has no rales.  Abdominal: Soft. Bowel sounds are normal. There is tenderness in the right upper quadrant, right lower quadrant, epigastric area and suprapubic area. There is CVA tenderness (right). There is no rebound and no guarding.  Musculoskeletal: Normal range of motion.  Lymphadenopathy:    She has no cervical adenopathy.  Neurological: She is alert and oriented to person, place, and time. No cranial nerve deficit.  Skin: Skin is warm and dry.  Psychiatric: She has a normal mood and affect. Her behavior is normal.    ED Course  Procedures Results for orders placed during the hospital encounter of 02/03/13 (from the past 24 hour(s))  CBC WITH DIFFERENTIAL     Status: Abnormal   Collection Time    02/03/13  2:40 PM      Result Value Range   WBC 3.8 (*) 4.0 - 10.5 K/uL   RBC 3.84 (*) 3.87 - 5.11 MIL/uL   Hemoglobin 10.5 (*) 12.0 - 15.0 g/dL   HCT 33.0 (*) 36.0 - 46.0 %   MCV 85.9  78.0 - 100.0 fL   MCH 27.3  26.0 - 34.0 pg   MCHC 31.8  30.0 - 36.0 g/dL   RDW 14.8  11.5 - 15.5 %   Platelets 190  150 - 400 K/uL   Neutrophils Relative % 64  43 - 77 %   Neutro Abs 2.4  1.7 - 7.7 K/uL   Lymphocytes Relative 21  12 - 46 %   Lymphs Abs 0.8  0.7 - 4.0 K/uL   Monocytes Relative 13 (*) 3 - 12 %   Monocytes Absolute 0.5  0.1 - 1.0 K/uL   Eosinophils Relative 2  0 - 5 %   Eosinophils Absolute 0.1  0.0 - 0.7 K/uL   Basophils Relative 0  0 - 1 %   Basophils Absolute 0.0  0.0 - 0.1 K/uL  COMPREHENSIVE METABOLIC PANEL     Status: Abnormal   Collection Time    02/03/13  2:40 PM      Result Value Range   Sodium 140  137 - 147 mEq/L   Potassium 3.6 (*) 3.7 - 5.3 mEq/L   Chloride 102  96 - 112 mEq/L   CO2 27  19 - 32 mEq/L   Glucose,  Bld 95  70 - 99  mg/dL   BUN 10  6 - 23 mg/dL   Creatinine, Ser 0.90  0.50 - 1.10 mg/dL   Calcium 8.9  8.4 - 10.5 mg/dL   Total Protein 7.1  6.0 - 8.3 g/dL   Albumin 2.9 (*) 3.5 - 5.2 g/dL   AST 23  0 - 37 U/L   ALT 14  0 - 35 U/L   Alkaline Phosphatase 78  39 - 117 U/L   Total Bilirubin 0.3  0.3 - 1.2 mg/dL   GFR calc non Af Amer 82 (*) >90 mL/min   GFR calc Af Amer >90  >90 mL/min  LIPASE, BLOOD     Status: None   Collection Time    02/03/13  2:40 PM      Result Value Range   Lipase 12  11 - 59 U/L  URINALYSIS, ROUTINE W REFLEX MICROSCOPIC     Status: Abnormal   Collection Time    02/03/13  2:45 PM      Result Value Range   Color, Urine YELLOW  YELLOW   APPearance CLOUDY (*) CLEAR   Specific Gravity, Urine 1.012  1.005 - 1.030   pH 7.0  5.0 - 8.0   Glucose, UA NEGATIVE  NEGATIVE mg/dL   Hgb urine dipstick SMALL (*) NEGATIVE   Bilirubin Urine NEGATIVE  NEGATIVE   Ketones, ur NEGATIVE  NEGATIVE mg/dL   Protein, ur NEGATIVE  NEGATIVE mg/dL   Urobilinogen, UA 0.2  0.0 - 1.0 mg/dL   Nitrite NEGATIVE  NEGATIVE   Leukocytes, UA SMALL (*) NEGATIVE  PREGNANCY, URINE     Status: None   Collection Time    02/03/13  2:45 PM      Result Value Range   Preg Test, Ur NEGATIVE  NEGATIVE  URINE MICROSCOPIC-ADD ON     Status: Abnormal   Collection Time    02/03/13  2:45 PM      Result Value Range   Squamous Epithelial / LPF MANY (*) RARE   WBC, UA 11-20  <3 WBC/hpf   RBC / HPF 7-10  <3 RBC/hpf   Bacteria, UA FEW (*) RARE    Ct Abdomen Pelvis W Contrast  02/03/2013   CLINICAL DATA:  Right-sided abdominal pain. Currently being treated for pyelonephritis. History of endometriosis. Fibroids. Anemia. Elevated liver function tests and renal stone.  EXAM: CT ABDOMEN AND PELVIS WITH CONTRAST  TECHNIQUE: Multidetector CT imaging of the abdomen and pelvis was performed using the standard protocol following bolus administration of intravenous contrast.  CONTRAST:  64mL OMNIPAQUE IOHEXOL 300 MG/ML SOLN, 148mL  OMNIPAQUE IOHEXOL 300 MG/ML SOLN  COMPARISON:  CT ABD/PELVIS W CM dated 09/17/2012; MR L SPINE W/O dated 04/08/2011  FINDINGS: Lower Chest: Patchy bibasilar airspace disease. Mild cardiomegaly, with small right greater than left pleural effusions.  Abdomen/Pelvis: Mild motion degradation within the abdomen. Grossly normal liver, spleen, stomach, pancreas, gallbladder, biliary tract, adrenal glands, left kidney.  Heterogeneous enhancement and enlargement of the right kidney, most consistent with moderate pyelonephritis. This is most apparent in the upper and interpolar regions. Focal hypoattenuation in the upper pole which measures 1.2 cm. Image 30.  No retroperitoneal or retrocrural adenopathy. Colonic stool burden suggests constipation. Normal terminal ileum. Normal appendix on image 65.  Normal small bowel without abdominal ascites.  No pelvic adenopathy. Normal urinary bladder. Enlargement and heterogeneity of the uterus. Posterior ill-defined 3.0 cm lower uterine segment mass is most consistent with a fibroid. This is positioned  eccentric right. No definite adnexal mass. Trace free pelvic fluid is likely physiologic.  Bones/Musculoskeletal: Transitional right-sided lumbosacral anatomy. No acute osseous abnormality.  IMPRESSION: 1. Right-sided moderate pyelonephritis. Focal area of hypoattenuation in the upper pole which is felt to be new since 09/17/2012. Focal infection is favored. No drainable fluid collection identified. If patient's symptoms persist or worsen, CT followup should be considered to exclude developing abscess. 2. Apparent passage of right-sided renal collecting system calculus since the prior exam. No definite ureteric stone or hydroureter identified. 3. Small bilateral pleural effusions. 4.  Possible constipation. 5. Uterine fibroids   Electronically Signed   By: Abigail Miyamoto M.D.   On: 02/03/2013 16:58    Dr. Mingo Amber in to examine patient and make note of his clinical finding.   IV fluids,  medication for nausea, IV antibiotics, labs, CT scan.  MDM: discussed with Dr. Vanita Panda prior to patient discharge   33 y.o. female with Pyelonephritis and flu like symptoms. She is stable for discharge home without concern for appendicitis at this time. She will follow up with her PCP and discuss possible out patient ultrasound if RUQ pain persist.    Medication List    STOP taking these medications       cephALEXin 500 MG capsule  Commonly known as:  KEFLEX      TAKE these medications       HYDROcodone-acetaminophen 5-325 MG per tablet  Commonly known as:  NORCO/VICODIN  Take 2 tablets by mouth every 4 (four) hours as needed.     levofloxacin 750 MG tablet  Commonly known as:  LEVAQUIN  Take 1 tablet (750 mg total) by mouth daily.      ASK your doctor about these medications       escitalopram 10 MG tablet  Commonly known as:  LEXAPRO  Take 10 mg by mouth daily.     levothyroxine 100 MCG tablet  Commonly known as:  SYNTHROID, LEVOTHROID  Take 100 mcg by mouth daily before breakfast.     omeprazole 20 MG capsule  Commonly known as:  PRILOSEC  Take one capsule by mouth one time daily     ondansetron 4 MG disintegrating tablet  Commonly known as:  ZOFRAN ODT  Take 1 tablet (4 mg total) by mouth every 8 (eight) hours as needed for nausea or vomiting.     PROBIOTIC DAILY PO  Take 1 tablet by mouth daily.            Ashley [place], [place] 02/05/13 [phone]

## 2013-02-03 NOTE — Discharge Instructions (Signed)
We are changing your antibiotic to cover your respiratory and kidney infection. Follow up with your doctor in the next couple day. Return here as needed for problems.  Pyelonephritis, Adult Pyelonephritis is a kidney infection. A kidney infection can happen quickly, or it can last for a long time. HOME CARE   Take your medicine (antibiotics) as told. Finish it even if you start to feel better.  Keep all doctor visits as told.  Drink enough fluids to keep your pee (urine) clear or pale yellow.  Only take medicine as told by your doctor. GET HELP RIGHT AWAY IF:   You have a fever or lasting symptoms for more than 2-3 days.  You have a fever and your symptoms suddenly get worse.  You cannot take your medicine or drink fluids as told.  You have chills and shaking.  You feel very weak or pass out (faint).  You do not feel better after 2 days. MAKE SURE YOU:  Understand these instructions.  Will watch your condition.  Will get help right away if you are not doing well or get worse. Document Released: 02/25/2004 Document Revised: 07/19/2011 Document Reviewed: 07/07/2010 [facility] Patient Information 2014 [place], [place].

## 2013-02-03 NOTE — ED Notes (Signed)
Pt seen at several facilities.  Treated for UTI.  Pt went to [facility] and was told to come to ED for CT scan or Ultrasound.  Pt having RUQ abdominal pain, epigastric pain and nausea.  Pt also has pyelonephritis and the flu.  (recently diagnosed)

## 2013-02-04 LAB — URINE CULTURE
COLONY COUNT: NO GROWTH
CULTURE: NO GROWTH

## 2013-02-05 NOTE — ED Provider Notes (Signed)
Medical screening examination/treatment/procedure(s) were conducted as a shared visit with non-physician practitioner(s) and myself.  I personally evaluated the patient during the encounter.  EKG Interpretation   None        Patient presents with abdominal pain. Here, right-sided pain on exam without rebound or guarding. Vitals are stable. Her lower extremities route UTI. CT shows pyelonephritis without stone. Given antibiotics to go home. Stable for discharge  Osvaldo Shipper, MD 02/05/13 2309

## 2013-04-30 ENCOUNTER — Other Ambulatory Visit: Payer: Self-pay | Admitting: Family Medicine

## 2013-04-30 DIAGNOSIS — R109 Unspecified abdominal pain: Secondary | ICD-10-CM

## 2013-05-07 ENCOUNTER — Ambulatory Visit
Admission: RE | Admit: 2013-05-07 | Discharge: 2013-05-07 | Disposition: A | Discharge status: Home / Self Care | Payer: Medicaid Other | Source: Ambulatory Visit | Attending: Family Medicine | Admitting: Family Medicine

## 2013-05-07 DIAGNOSIS — R109 Unspecified abdominal pain: Secondary | ICD-10-CM

## 2013-05-13 ENCOUNTER — Other Ambulatory Visit: Payer: Self-pay | Admitting: Family Medicine

## 2013-05-13 ENCOUNTER — Ambulatory Visit
Admission: RE | Admit: 2013-05-13 | Discharge: 2013-05-13 | Disposition: A | Discharge status: Home / Self Care | Payer: Medicaid Other | Source: Ambulatory Visit | Attending: Family Medicine | Admitting: Family Medicine

## 2013-05-13 DIAGNOSIS — R109 Unspecified abdominal pain: Secondary | ICD-10-CM

## 2013-07-17 ENCOUNTER — Other Ambulatory Visit: Payer: Self-pay

## 2013-07-25 LAB — OB RESULTS CONSOLE HEPATITIS B SURFACE ANTIGEN: HEP B S AG: NEGATIVE

## 2013-07-25 LAB — OB RESULTS CONSOLE HIV ANTIBODY (ROUTINE TESTING): HIV: NONREACTIVE

## 2013-07-25 LAB — OB RESULTS CONSOLE GC/CHLAMYDIA
Chlamydia: NEGATIVE
Gonorrhea: NEGATIVE

## 2013-07-25 LAB — OB RESULTS CONSOLE ABO/RH: RH TYPE: POSITIVE

## 2013-07-25 LAB — OB RESULTS CONSOLE ANTIBODY SCREEN: Antibody Screen: NEGATIVE

## 2013-07-25 LAB — OB RESULTS CONSOLE RPR: RPR: NONREACTIVE

## 2013-09-19 ENCOUNTER — Ambulatory Visit: Payer: BC Managed Care – PPO | Admitting: Physical Therapy

## 2013-10-10 ENCOUNTER — Other Ambulatory Visit (HOSPITAL_COMMUNITY): Payer: Self-pay | Admitting: Obstetrics & Gynecology

## 2013-10-10 DIAGNOSIS — O09529 Supervision of elderly multigravida, unspecified trimester: Secondary | ICD-10-CM

## 2013-10-22 ENCOUNTER — Ambulatory Visit (HOSPITAL_COMMUNITY)
Admission: RE | Admit: 2013-10-22 | Discharge: 2013-10-22 | Disposition: A | Discharge status: Home / Self Care | Payer: Medicaid Other | Source: Ambulatory Visit | Attending: Obstetrics & Gynecology | Admitting: Obstetrics & Gynecology

## 2013-10-22 ENCOUNTER — Encounter (HOSPITAL_COMMUNITY): Payer: Self-pay

## 2013-10-22 VITALS — BP 121/69 | HR 64 | Wt 152.0 lb

## 2013-10-22 DIAGNOSIS — IMO0001 Reserved for inherently not codable concepts without codable children: Secondary | ICD-10-CM | POA: Diagnosis not present

## 2013-10-22 DIAGNOSIS — O09512 Supervision of elderly primigravida, second trimester: Secondary | ICD-10-CM

## 2013-10-22 DIAGNOSIS — O09529 Supervision of elderly multigravida, unspecified trimester: Secondary | ICD-10-CM | POA: Insufficient documentation

## 2013-10-24 DIAGNOSIS — O09519 Supervision of elderly primigravida, unspecified trimester: Secondary | ICD-10-CM | POA: Insufficient documentation

## 2013-10-24 DIAGNOSIS — O09899 Supervision of other high risk pregnancies, unspecified trimester: Secondary | ICD-10-CM | POA: Insufficient documentation

## 2013-10-24 NOTE — Progress Notes (Signed)
Genetic Counseling  High-Risk Gestation Note  Appointment Date:  10/22/2013 Referred By: Alinda Dooms, MD Date of Birth:  06-22-1980    Pregnancy History: G1P0 Estimated Date of Delivery: 02/20/14 Estimated Gestational Age: [redacted]w[redacted]d Attending: Renella Cunas, MD   Ms. [name] [name] was seen for genetic counseling because of a maternal age of 33 y.o. and the ultrasound finding of single umbilical artery.   She was counseled regarding maternal age and the association with risk for chromosome conditions due to nondisjunction with aging of the ova.   We reviewed chromosomes, nondisjunction, and the associated 1 in 141 risk for fetal aneuploidy related to a maternal age of 33 y.o. at [redacted]w[redacted]d gestation.  She was counseled that the risk for aneuploidy decreases as gestational age increases, accounting for those pregnancies which spontaneously abort.  We specifically discussed Down syndrome (trisomy 59), trisomies 47 and 75, and sex chromosome aneuploidies (47,XXX and 47,XXY) including the common features and prognoses of each.   We reviewed available screening options including noninvasive prenatal screening (NIPS)/cell free DNA (cfDNA) testing, and detailed ultrasound.  She was counseled that screening tests are used to modify a patient's a priori risk for aneuploidy, typically based on age. This estimate provides a pregnancy specific risk assessment. We briefly reviewed the benefits and limitations of each option. We discussed the diagnostic option of amniocentesis. Ms. [name] [name] indicated that she was aware of the additional option of amniocentesis and declined detailed discussion of this testing today. We reviewed the results of detailed ultrasound performed today. Visualized fetal anatomy appeared normal. Single umbilical artery was visualized at the time of today's visit. Complete ultrasound results reported separately. [name] [name] was counseled that SUA occurs in approximately 1 in every 200  pregnancies and is defined as the presence of one umbilical artery and one umbilical vein, instead of the typical three vessel cord containing two arteries and one vein.  We discussed that the finding of an isolated SUA (no other markers or anomalies visualized) is not thought to increase the risk for fetal aneuploidy. However, the literature is conflicting regarding an association between this finding and congenital heart defects.  In addition, we discussed that literature is also conflicting regarding the presence of SUA and an association with an increased risk for fetal growth restriction.  We reviewed the options of serial ultrasound to monitor fetal growth and a fetal echocardiogram.  Fetal echocardiogram was scheduled. No follow-up appointments were made for the patient in our office at this time.   After careful consideration, Ms. [name] [name] declined additional screening or testing for fetal aneuploidy (NIPS and amniocentesis) given that she stated that information from additional screening or testing could potentially cause her more worry during the pregnancy and would prefer waiting until delivery to obtain additional information. She understands that ultrasound cannot diagnose or rule out all birth defects or genetic conditions.   Both family histories were reviewed and found to be noncontributory for birth defects, intellectual disability, and known genetic conditions. Without further information regarding the provided family history, an accurate genetic risk cannot be calculated. Further genetic counseling is warranted if more information is obtained.  Ms. [name] [name] denied exposure to environmental toxins or chemical agents. She denied the use of alcohol, tobacco or street drugs. She denied significant viral illnesses during the course of her pregnancy. Her medical and surgical histories were contributory for depression, for which she is treated with zoloft. She also reported taking  levothyroxine, loratadine, and pantoprozole during the pregnancy.  Based on  available study data of use on Zoloft during human pregnancy, no apparent association with increased risk of birth defects is suggested. Use of Zoloft and other serotonin reuptake inhibitors late in gestation has been associated with an increased risk of mild transient neonatal syndrome of central nervous system including irritability, vomiting, jitteriness and convulsions, as well as neonatal pulmonary hypertension. These associated symptoms typically do not appear to occur frequently or severely. Even though a limited number of medicines are known to cause birth defects, we cannot say that it is completely safe to use any medicines during pregnancy.  It is also not possible to predict any drug-drug interactions that occur, or how they might affect a pregnancy.  The use of medications is recommended in pregnancy only if the benefit to the mother (and thus the pregnancy) outweighs potential risks to the baby.  Sometimes the maternal use of medications, dictated by a medical condition, may even be more beneficial to a pregnancy than not taking the medication(s) at all.  I counseled Ms. [name] [name] regarding the above risks and available options.  The approximate face-to-face time with the genetic counselor was 25 minutes.  Chipper Oman, MS,  Certified Genetic Counselor 10/24/2013

## 2013-11-15 ENCOUNTER — Other Ambulatory Visit (HOSPITAL_COMMUNITY): Payer: Self-pay | Admitting: Obstetrics & Gynecology

## 2013-11-15 DIAGNOSIS — Z3A26 26 weeks gestation of pregnancy: Secondary | ICD-10-CM

## 2013-11-15 DIAGNOSIS — O365931 Maternal care for other known or suspected poor fetal growth, third trimester, fetus 1: Secondary | ICD-10-CM

## 2013-11-15 DIAGNOSIS — O283 Abnormal ultrasonic finding on antenatal screening of mother: Secondary | ICD-10-CM

## 2013-11-19 ENCOUNTER — Ambulatory Visit (HOSPITAL_COMMUNITY): Admission: RE | Admit: 2013-11-19 | Payer: Medicaid Other | Source: Ambulatory Visit

## 2013-11-19 ENCOUNTER — Other Ambulatory Visit (HOSPITAL_COMMUNITY): Payer: Self-pay | Admitting: Obstetrics & Gynecology

## 2013-11-19 ENCOUNTER — Ambulatory Visit (HOSPITAL_COMMUNITY)
Admission: RE | Admit: 2013-11-19 | Discharge: 2013-11-19 | Disposition: A | Discharge status: Home / Self Care | Payer: Medicaid Other | Source: Ambulatory Visit | Attending: Family Medicine | Admitting: Family Medicine

## 2013-11-19 ENCOUNTER — Encounter (HOSPITAL_COMMUNITY): Payer: Self-pay

## 2013-11-19 VITALS — BP 119/71 | HR 64 | Wt 157.0 lb

## 2013-11-19 DIAGNOSIS — O09513 Supervision of elderly primigravida, third trimester: Secondary | ICD-10-CM | POA: Insufficient documentation

## 2013-11-19 DIAGNOSIS — O283 Abnormal ultrasonic finding on antenatal screening of mother: Secondary | ICD-10-CM

## 2013-11-19 DIAGNOSIS — O289 Unspecified abnormal findings on antenatal screening of mother: Secondary | ICD-10-CM | POA: Insufficient documentation

## 2013-11-19 DIAGNOSIS — Z3A26 26 weeks gestation of pregnancy: Secondary | ICD-10-CM

## 2013-11-19 DIAGNOSIS — O365931 Maternal care for other known or suspected poor fetal growth, third trimester, fetus 1: Secondary | ICD-10-CM

## 2013-11-19 DIAGNOSIS — IMO0001 Reserved for inherently not codable concepts without codable children: Secondary | ICD-10-CM

## 2013-12-02 ENCOUNTER — Encounter (HOSPITAL_COMMUNITY): Payer: Self-pay

## 2013-12-04 ENCOUNTER — Other Ambulatory Visit (HOSPITAL_COMMUNITY): Payer: Self-pay | Admitting: Maternal and Fetal Medicine

## 2013-12-04 ENCOUNTER — Ambulatory Visit (HOSPITAL_COMMUNITY)
Admission: RE | Admit: 2013-12-04 | Discharge: 2013-12-04 | Disposition: A | Discharge status: Home / Self Care | Payer: Medicaid Other | Source: Ambulatory Visit | Attending: Obstetrics & Gynecology | Admitting: Obstetrics & Gynecology

## 2013-12-04 DIAGNOSIS — IMO0001 Reserved for inherently not codable concepts without codable children: Secondary | ICD-10-CM

## 2013-12-04 DIAGNOSIS — O365931 Maternal care for other known or suspected poor fetal growth, third trimester, fetus 1: Secondary | ICD-10-CM

## 2013-12-04 DIAGNOSIS — O09513 Supervision of elderly primigravida, third trimester: Secondary | ICD-10-CM

## 2013-12-04 DIAGNOSIS — Z3A28 28 weeks gestation of pregnancy: Secondary | ICD-10-CM | POA: Insufficient documentation

## 2013-12-05 ENCOUNTER — Other Ambulatory Visit (HOSPITAL_COMMUNITY): Payer: Self-pay

## 2013-12-22 ENCOUNTER — Inpatient Hospital Stay (HOSPITAL_COMMUNITY): Payer: Medicaid Other | Admitting: Anesthesiology

## 2013-12-22 ENCOUNTER — Inpatient Hospital Stay (HOSPITAL_COMMUNITY): Payer: Medicaid Other

## 2013-12-22 ENCOUNTER — Encounter (HOSPITAL_COMMUNITY)
Admission: AD | Disposition: A | Discharge status: Home / Self Care | Payer: Self-pay | Source: Ambulatory Visit | Attending: Obstetrics and Gynecology

## 2013-12-22 ENCOUNTER — Inpatient Hospital Stay (HOSPITAL_COMMUNITY)
Admission: AD | Admit: 2013-12-22 | Discharge: 2013-12-25 | DRG: 765 | Disposition: A | Discharge status: Home / Self Care | Payer: Medicaid Other | Source: Ambulatory Visit | Attending: Obstetrics and Gynecology | Admitting: Obstetrics and Gynecology

## 2013-12-22 ENCOUNTER — Encounter (HOSPITAL_COMMUNITY): Payer: Self-pay | Admitting: *Deleted

## 2013-12-22 DIAGNOSIS — O09513 Supervision of elderly primigravida, third trimester: Secondary | ICD-10-CM

## 2013-12-22 DIAGNOSIS — O26879 Cervical shortening, unspecified trimester: Secondary | ICD-10-CM | POA: Diagnosis present

## 2013-12-22 DIAGNOSIS — F329 Major depressive disorder, single episode, unspecified: Secondary | ICD-10-CM | POA: Diagnosis present

## 2013-12-22 DIAGNOSIS — O9902 Anemia complicating childbirth: Secondary | ICD-10-CM | POA: Diagnosis present

## 2013-12-22 DIAGNOSIS — O99344 Other mental disorders complicating childbirth: Secondary | ICD-10-CM | POA: Diagnosis present

## 2013-12-22 DIAGNOSIS — O4690 Antepartum hemorrhage, unspecified, unspecified trimester: Secondary | ICD-10-CM

## 2013-12-22 DIAGNOSIS — IMO0001 Reserved for inherently not codable concepts without codable children: Secondary | ICD-10-CM

## 2013-12-22 DIAGNOSIS — F53 Puerperal psychosis: Secondary | ICD-10-CM | POA: Diagnosis not present

## 2013-12-22 DIAGNOSIS — Z3A31 31 weeks gestation of pregnancy: Secondary | ICD-10-CM | POA: Diagnosis present

## 2013-12-22 DIAGNOSIS — F29 Unspecified psychosis not due to a substance or known physiological condition: Secondary | ICD-10-CM | POA: Diagnosis not present

## 2013-12-22 DIAGNOSIS — O99284 Endocrine, nutritional and metabolic diseases complicating childbirth: Secondary | ICD-10-CM | POA: Diagnosis present

## 2013-12-22 DIAGNOSIS — O4590 Premature separation of placenta, unspecified, unspecified trimester: Secondary | ICD-10-CM | POA: Diagnosis present

## 2013-12-22 DIAGNOSIS — R58 Hemorrhage, not elsewhere classified: Secondary | ICD-10-CM

## 2013-12-22 DIAGNOSIS — O4593 Premature separation of placenta, unspecified, third trimester: Principal | ICD-10-CM | POA: Diagnosis present

## 2013-12-22 DIAGNOSIS — O321XX0 Maternal care for breech presentation, not applicable or unspecified: Secondary | ICD-10-CM | POA: Diagnosis present

## 2013-12-22 DIAGNOSIS — D649 Anemia, unspecified: Secondary | ICD-10-CM | POA: Diagnosis present

## 2013-12-22 DIAGNOSIS — E039 Hypothyroidism, unspecified: Secondary | ICD-10-CM | POA: Diagnosis present

## 2013-12-22 DIAGNOSIS — O3413 Maternal care for benign tumor of corpus uteri, third trimester: Secondary | ICD-10-CM | POA: Diagnosis present

## 2013-12-22 LAB — CBC
HEMATOCRIT: 32.1 % — AB (ref 36.0–46.0)
HEMOGLOBIN: 10.2 g/dL — AB (ref 12.0–15.0)
MCH: 27.5 pg (ref 26.0–34.0)
MCHC: 31.8 g/dL (ref 30.0–36.0)
MCV: 86.5 fL (ref 78.0–100.0)
Platelets: 188 10*3/uL (ref 150–400)
RBC: 3.71 MIL/uL — ABNORMAL LOW (ref 3.87–5.11)
RDW: 20 % — AB (ref 11.5–15.5)
WBC: 9 10*3/uL (ref 4.0–10.5)

## 2013-12-22 LAB — TYPE AND SCREEN
ABO/RH(D): O POS
ANTIBODY SCREEN: NEGATIVE

## 2013-12-22 LAB — URINALYSIS, ROUTINE W REFLEX MICROSCOPIC
Bilirubin Urine: NEGATIVE
GLUCOSE, UA: NEGATIVE mg/dL
KETONES UR: NEGATIVE mg/dL
Nitrite: NEGATIVE
PH: 7 (ref 5.0–8.0)
Protein, ur: NEGATIVE mg/dL
Specific Gravity, Urine: <1.005 — ABNORMAL LOW (ref 1.005–1.030)
Urobilinogen, UA: 0.2 mg/dL (ref 0.0–1.0)

## 2013-12-22 LAB — DIFFERENTIAL
BASOS ABS: 0 10*3/uL (ref 0.0–0.1)
Basophils Relative: 0 % (ref 0–1)
Eosinophils Absolute: 0.1 10*3/uL (ref 0.0–0.7)
Eosinophils Relative: 1 % (ref 0–5)
Lymphocytes Relative: 12 % (ref 12–46)
Lymphs Abs: 1.1 10*3/uL (ref 0.7–4.0)
MONO ABS: 0.5 10*3/uL (ref 0.1–1.0)
Monocytes Relative: 6 % (ref 3–12)
NEUTROS ABS: 7.3 10*3/uL (ref 1.7–7.7)
NEUTROS PCT: 81 % — AB (ref 43–77)

## 2013-12-22 LAB — HEPATIC FUNCTION PANEL
ALT: 16 U/L (ref 0–35)
AST: 22 U/L (ref 0–37)
Albumin: 2.4 g/dL — ABNORMAL LOW (ref 3.5–5.2)
Alkaline Phosphatase: 110 U/L (ref 39–117)
Total Protein: 5.8 g/dL — ABNORMAL LOW (ref 6.0–8.3)

## 2013-12-22 LAB — URINE MICROSCOPIC-ADD ON

## 2013-12-22 LAB — GROUP B STREP BY PCR: Group B strep by PCR: NEGATIVE

## 2013-12-22 LAB — WET PREP, GENITAL
Clue Cells Wet Prep HPF POC: NONE SEEN
Trich, Wet Prep: NONE SEEN
Yeast Wet Prep HPF POC: NONE SEEN

## 2013-12-22 LAB — OB RESULTS CONSOLE GBS: STREP GROUP B AG: NEGATIVE

## 2013-12-22 LAB — ABO/RH: ABO/RH(D): O POS

## 2013-12-22 LAB — FETAL FIBRONECTIN: FETAL FIBRONECTIN: POSITIVE — AB

## 2013-12-22 SURGERY — Surgical Case
Anesthesia: General | Site: Abdomen

## 2013-12-22 MED ORDER — FERROUS SULFATE 325 (65 FE) MG PO TABS
650.0000 mg | ORAL_TABLET | Freq: Two times a day (BID) | ORAL | Status: DC
  Filled 2013-12-22: qty 2

## 2013-12-22 MED ORDER — PRENATAL MULTIVITAMIN CH: 1.0000 | ORAL_TABLET | Freq: Every day | ORAL | Status: DC

## 2013-12-22 MED ORDER — OXYCODONE-ACETAMINOPHEN 5-325 MG PO TABS: 2.0000 | ORAL_TABLET | ORAL | Status: DC | PRN

## 2013-12-22 MED ORDER — SERTRALINE HCL 25 MG PO TABS
25.0000 mg | ORAL_TABLET | Freq: Every day | ORAL | Status: DC
  Administered 2013-12-23 – 2013-12-25 (×3): 25 mg via ORAL
  Filled 2013-12-22 (×4): qty 1

## 2013-12-22 MED ORDER — CALCIUM CARBONATE ANTACID 500 MG PO CHEW: 2.0000 | CHEWABLE_TABLET | ORAL | Status: DC | PRN

## 2013-12-22 MED ORDER — ZOLPIDEM TARTRATE 5 MG PO TABS: 5.0000 mg | ORAL_TABLET | Freq: Every evening | ORAL | Status: DC | PRN

## 2013-12-22 MED ORDER — CEFAZOLIN SODIUM-DEXTROSE 2-3 GM-% IV SOLR
INTRAVENOUS | Status: DC | PRN
  Administered 2013-12-22: 2 g via INTRAVENOUS

## 2013-12-22 MED ORDER — OXYCODONE HCL 5 MG PO TABS: 5.0000 mg | ORAL_TABLET | Freq: Once | ORAL | Status: AC | PRN

## 2013-12-22 MED ORDER — ONDANSETRON HCL 4 MG/2ML IJ SOLN
INTRAMUSCULAR | Status: AC
  Filled 2013-12-22: qty 2

## 2013-12-22 MED ORDER — BUTORPHANOL TARTRATE 1 MG/ML IJ SOLN
2.0000 mg | Freq: Once | INTRAMUSCULAR | Status: AC
  Administered 2013-12-22: 2 mg via INTRAVENOUS
  Filled 2013-12-22: qty 2

## 2013-12-22 MED ORDER — SCOPOLAMINE 1 MG/3DAYS TD PT72
MEDICATED_PATCH | TRANSDERMAL | Status: AC
  Filled 2013-12-22: qty 1

## 2013-12-22 MED ORDER — MEPERIDINE HCL 25 MG/ML IJ SOLN: 6.2500 mg | INTRAMUSCULAR | Status: DC | PRN

## 2013-12-22 MED ORDER — SODIUM CHLORIDE 0.9 % IJ SOLN
INTRAMUSCULAR | Status: AC
  Filled 2013-12-22: qty 3

## 2013-12-22 MED ORDER — LORATADINE 10 MG PO TABS
10.0000 mg | ORAL_TABLET | Freq: Every day | ORAL | Status: DC
  Filled 2013-12-22 (×2): qty 1

## 2013-12-22 MED ORDER — DEXAMETHASONE SODIUM PHOSPHATE 4 MG/ML IJ SOLN
INTRAMUSCULAR | Status: DC | PRN
  Administered 2013-12-22: 4 mg via INTRAVENOUS

## 2013-12-22 MED ORDER — ONDANSETRON HCL 4 MG/2ML IJ SOLN: 4.0000 mg | Freq: Four times a day (QID) | INTRAMUSCULAR | Status: DC | PRN

## 2013-12-22 MED ORDER — OXYTOCIN 10 UNIT/ML IJ SOLN
INTRAMUSCULAR | Status: AC
  Filled 2013-12-22: qty 4

## 2013-12-22 MED ORDER — FENTANYL CITRATE 0.05 MG/ML IJ SOLN
INTRAMUSCULAR | Status: DC | PRN
  Administered 2013-12-22 (×2): 250 ug via INTRAVENOUS

## 2013-12-22 MED ORDER — MIDAZOLAM HCL 2 MG/2ML IJ SOLN
INTRAMUSCULAR | Status: AC
  Filled 2013-12-22: qty 2

## 2013-12-22 MED ORDER — LACTATED RINGERS IV SOLN
INTRAVENOUS | Status: DC | PRN
  Administered 2013-12-22 (×2): via INTRAVENOUS

## 2013-12-22 MED ORDER — PROMETHAZINE HCL 25 MG/ML IJ SOLN
25.0000 mg | Freq: Four times a day (QID) | INTRAMUSCULAR | Status: DC | PRN
  Administered 2013-12-22: 25 mg via INTRAVENOUS
  Filled 2013-12-22: qty 1

## 2013-12-22 MED ORDER — CITRIC ACID-SODIUM CITRATE 334-500 MG/5ML PO SOLN: 30.0000 mL | ORAL | Status: DC | PRN

## 2013-12-22 MED ORDER — BETAMETHASONE SOD PHOS & ACET 6 (3-3) MG/ML IJ SUSP
12.0000 mg | Freq: Once | INTRAMUSCULAR | Status: AC
  Administered 2013-12-22: 12 mg via INTRAMUSCULAR
  Filled 2013-12-22: qty 2

## 2013-12-22 MED ORDER — LACTATED RINGERS IV SOLN: 500.0000 mL | INTRAVENOUS | Status: DC | PRN

## 2013-12-22 MED ORDER — PROPOFOL 10 MG/ML IV EMUL
INTRAVENOUS | Status: AC
  Filled 2013-12-22: qty 20

## 2013-12-22 MED ORDER — PANTOPRAZOLE SODIUM 40 MG PO TBEC: 40.0000 mg | DELAYED_RELEASE_TABLET | Freq: Every day | ORAL | Status: DC

## 2013-12-22 MED ORDER — OXYTOCIN 10 UNIT/ML IJ SOLN
40.0000 [IU] | INTRAVENOUS | Status: DC | PRN
  Administered 2013-12-22: 40 [IU] via INTRAVENOUS

## 2013-12-22 MED ORDER — OXYCODONE HCL 5 MG/5ML PO SOLN: 5.0000 mg | Freq: Once | ORAL | Status: AC | PRN

## 2013-12-22 MED ORDER — LACTATED RINGERS IV SOLN
INTRAVENOUS | Status: DC | PRN
  Administered 2013-12-22 (×2): via INTRAVENOUS

## 2013-12-22 MED ORDER — PROMETHAZINE HCL 25 MG/ML IJ SOLN: 6.2500 mg | INTRAMUSCULAR | Status: DC | PRN

## 2013-12-22 MED ORDER — LACTATED RINGERS IV BOLUS (SEPSIS)
1000.0000 mL | Freq: Once | INTRAVENOUS | Status: AC
  Administered 2013-12-22: 1000 mL via INTRAVENOUS

## 2013-12-22 MED ORDER — ACETAMINOPHEN 325 MG PO TABS: 650.0000 mg | ORAL_TABLET | ORAL | Status: DC | PRN

## 2013-12-22 MED ORDER — FENTANYL CITRATE 0.05 MG/ML IJ SOLN
INTRAMUSCULAR | Status: AC
  Filled 2013-12-22: qty 5

## 2013-12-22 MED ORDER — OXYTOCIN 40 UNITS IN LACTATED RINGERS INFUSION - SIMPLE MED: 62.5000 mL/h | INTRAVENOUS | Status: DC

## 2013-12-22 MED ORDER — SCOPOLAMINE 1 MG/3DAYS TD PT72
MEDICATED_PATCH | TRANSDERMAL | Status: DC | PRN
  Administered 2013-12-22: 1 via TRANSDERMAL

## 2013-12-22 MED ORDER — LACTATED RINGERS IV SOLN: INTRAVENOUS | Status: DC

## 2013-12-22 MED ORDER — OXYTOCIN BOLUS FROM INFUSION: 500.0000 mL | INTRAVENOUS | Status: DC

## 2013-12-22 MED ORDER — NIFEDIPINE 10 MG PO CAPS
20.0000 mg | ORAL_CAPSULE | Freq: Once | ORAL | Status: AC
  Administered 2013-12-22: 20 mg via ORAL
  Filled 2013-12-22: qty 2

## 2013-12-22 MED ORDER — DOCUSATE SODIUM 100 MG PO CAPS: 100.0000 mg | ORAL_CAPSULE | Freq: Every day | ORAL | Status: DC

## 2013-12-22 MED ORDER — SUCCINYLCHOLINE CHLORIDE 20 MG/ML IJ SOLN
INTRAMUSCULAR | Status: AC
  Filled 2013-12-22: qty 10

## 2013-12-22 MED ORDER — SUCCINYLCHOLINE CHLORIDE 20 MG/ML IJ SOLN
INTRAMUSCULAR | Status: DC | PRN
  Administered 2013-12-22: 120 mg via INTRAVENOUS

## 2013-12-22 MED ORDER — MIDAZOLAM HCL 5 MG/5ML IJ SOLN
INTRAMUSCULAR | Status: DC | PRN
  Administered 2013-12-22: 2 mg via INTRAVENOUS

## 2013-12-22 MED ORDER — OXYCODONE-ACETAMINOPHEN 5-325 MG PO TABS: 1.0000 | ORAL_TABLET | ORAL | Status: DC | PRN

## 2013-12-22 MED ORDER — HYDROMORPHONE HCL 1 MG/ML IJ SOLN
0.2500 mg | INTRAMUSCULAR | Status: DC | PRN
  Administered 2013-12-22 (×2): 0.25 mg via INTRAVENOUS

## 2013-12-22 MED ORDER — PROPOFOL 10 MG/ML IV BOLUS
INTRAVENOUS | Status: DC | PRN
  Administered 2013-12-22: 150 mg via INTRAVENOUS

## 2013-12-22 MED ORDER — BUTORPHANOL TARTRATE 1 MG/ML IJ SOLN: 2.0000 mg | Freq: Once | INTRAMUSCULAR | Status: DC

## 2013-12-22 MED ORDER — NIFEDIPINE 10 MG PO CAPS
10.0000 mg | ORAL_CAPSULE | Freq: Four times a day (QID) | ORAL | Status: DC
  Filled 2013-12-22 (×3): qty 1

## 2013-12-22 MED ORDER — BETAMETHASONE SOD PHOS & ACET 6 (3-3) MG/ML IJ SUSP: 12.0000 mg | Freq: Once | INTRAMUSCULAR | Status: DC

## 2013-12-22 MED ORDER — VITAMIN D3 25 MCG (1000 UNIT) PO TABS
5000.0000 [IU] | ORAL_TABLET | Freq: Every day | ORAL | Status: DC
  Filled 2013-12-22: qty 5

## 2013-12-22 MED ORDER — CITRIC ACID-SODIUM CITRATE 334-500 MG/5ML PO SOLN
ORAL | Status: AC
  Filled 2013-12-22: qty 15

## 2013-12-22 MED ORDER — HYDROMORPHONE HCL 1 MG/ML IJ SOLN
INTRAMUSCULAR | Status: AC
Start: 2013-12-22 — End: 2013-12-22
  Administered 2013-12-22: 0.25 mg via INTRAVENOUS
  Filled 2013-12-22: qty 1

## 2013-12-22 MED ORDER — LEVOTHYROXINE SODIUM 50 MCG PO TABS
50.0000 ug | ORAL_TABLET | Freq: Every day | ORAL | Status: DC
  Administered 2013-12-23 – 2013-12-25 (×3): 50 ug via ORAL
  Filled 2013-12-22 (×5): qty 1

## 2013-12-22 MED ORDER — ONDANSETRON HCL 4 MG/2ML IJ SOLN
INTRAMUSCULAR | Status: DC | PRN
  Administered 2013-12-22: 4 mg via INTRAVENOUS

## 2013-12-22 MED ORDER — LACTATED RINGERS IV SOLN
INTRAVENOUS | Status: DC
  Administered 2013-12-22: 16:00:00 via INTRAVENOUS

## 2013-12-22 MED ORDER — LIDOCAINE HCL (PF) 1 % IJ SOLN: 30.0000 mL | INTRAMUSCULAR | Status: DC | PRN

## 2013-12-22 SURGICAL SUPPLY — 37 items
APL SKNCLS STERI-STRIP NONHPOA (GAUZE/BANDAGES/DRESSINGS) ×1
BENZOIN TINCTURE PRP APPL 2/3 (GAUZE/BANDAGES/DRESSINGS) ×1 IMPLANT
CLAMP CORD UMBIL (MISCELLANEOUS) IMPLANT
CLOTH BEACON ORANGE TIMEOUT ST (SAFETY) ×2 IMPLANT
CONTAINER PREFILL 10% NBF 15ML (MISCELLANEOUS) IMPLANT
DRAPE SHEET LG 3/4 BI-LAMINATE (DRAPES) IMPLANT
DRSG OPSITE POSTOP 4X10 (GAUZE/BANDAGES/DRESSINGS) ×2 IMPLANT
DURAPREP 26ML APPLICATOR (WOUND CARE) ×2 IMPLANT
ELECT REM PT RETURN 9FT ADLT (ELECTROSURGICAL) ×2
ELECTRODE REM PT RTRN 9FT ADLT (ELECTROSURGICAL) ×1 IMPLANT
EXTRACTOR VACUUM M CUP 4 TUBE (SUCTIONS) IMPLANT
GLOVE BIOGEL PI IND STRL 7.0 (GLOVE) ×1 IMPLANT
GLOVE BIOGEL PI INDICATOR 7.0 (GLOVE) ×1
GLOVE SURG SS PI 6.5 STRL IVOR (GLOVE) ×2 IMPLANT
GOWN STRL REUS W/TWL LRG LVL3 (GOWN DISPOSABLE) ×5 IMPLANT
KIT ABG SYR 3ML LUER SLIP (SYRINGE) ×1 IMPLANT
LIQUID BAND (GAUZE/BANDAGES/DRESSINGS) IMPLANT
NDL HYPO 25X5/8 SAFETYGLIDE (NEEDLE) IMPLANT
NEEDLE HYPO 25X5/8 SAFETYGLIDE (NEEDLE) ×2 IMPLANT
NS IRRIG 1000ML POUR BTL (IV SOLUTION) ×2 IMPLANT
PACK C SECTION WH (CUSTOM PROCEDURE TRAY) ×2 IMPLANT
PAD OB MATERNITY 4.3X12.25 (PERSONAL CARE ITEMS) ×2 IMPLANT
RTRCTR C-SECT PINK 25CM LRG (MISCELLANEOUS) ×1 IMPLANT
STRIP CLOSURE SKIN 1/2X4 (GAUZE/BANDAGES/DRESSINGS) ×1 IMPLANT
SUT CHROMIC 2 0 CT 1 (SUTURE) IMPLANT
SUT MON AB 4-0 PS1 27 (SUTURE) ×2 IMPLANT
SUT PLAIN 1 NONE 54 (SUTURE) IMPLANT
SUT PLAIN 2 0 (SUTURE)
SUT PLAIN 2 0 XLH (SUTURE) IMPLANT
SUT PLAIN ABS 2-0 CT1 27XMFL (SUTURE) IMPLANT
SUT VIC AB 0 CTX 36 (SUTURE) ×2
SUT VIC AB 0 CTX36XBRD ANBCTRL (SUTURE) ×1 IMPLANT
SUT VIC AB 1 CTX 36 (SUTURE) ×4
SUT VIC AB 1 CTX36XBRD ANBCTRL (SUTURE) ×2 IMPLANT
TOWEL OR 17X24 6PK STRL BLUE (TOWEL DISPOSABLE) ×2 IMPLANT
TRAY FOLEY CATH 14FR (SET/KITS/TRAYS/PACK) ×2 IMPLANT
WATER STERILE IRR 1000ML POUR (IV SOLUTION) ×1 IMPLANT

## 2013-12-22 NOTE — MAU Note (Signed)
Patient states that she saw a spot of blood when she wiped while voiding on arrival to unit.

## 2013-12-22 NOTE — Op Note (Addendum)
[name] [name] PROCEDURE DATE: 12/22/2013  PREOPERATIVE DIAGNOSIS: Intrauterine pregnancy at  [redacted]w[redacted]d weeks gestation; malpresentation: footling breech presentation with possible abruption  POSTOPERATIVE DIAGNOSIS:  Intrauterine pregnancy at  [redacted]w[redacted]d weeks gestation; malpresentation: footling breech presentation; placental abruption  PROCEDURE: Primary/Repeat Low Transverse Cesarean Section  SURGEON:  Dr. Hoyle Sauer L. Harraway-Smith/ Dr. Willey Blade  Complications: none immediate   INDICATIONS: [name] [name] is a 33 y.o. G1P0 at [redacted]w[redacted]d. I was called to see patient for possible abruption with footling breech presentation.  Upon evaluating the patient there was an excessive amount of bleeding including blood soaking the stretcher and dripping onto the floor.  A decision was made to perform an emergent delivery.  Due to the level of the emergency I was able to speak to the patient briefly prior to general anesthesia and explain to her the reason for the emergency.  Verbal consent was obtained,    FINDINGS:  Viable female infant in footling breech presentation.  Tight nuchal  Cord x 2. Apgars: pending Clear amniotic fluid.  Intact placenta, two vessel cord.  Multiple fibroids were noted in the uterus, fallopian tubes and ovaries bilaterally.  PROCEDURE IN DETAIL:  The patient preoperatively received intravenous antibiotics and had sequential compression devices applied to her lower extremities.  She was then taken to the operating room where general  anesthesia was administered and was found to be adequate. She was then placed in a dorsal supine position with a leftward tilt, and was splashed with Betadine and draped in a sterile manner.  A foley catheter was placed into her bladder and attached to constant gravity.  A Pfannenstiel skin incision was made with scalpel and carried through to the underlying layer of fascia. The fascia was incised in the midline, and this incision was extended bilaterally using the  Mayo scissors.  Kocher clamps were applied to the superior aspect of the fascial incision and the underlying rectus muscles were dissected off bluntly. A similar process was carried out on the inferior aspect of the fascial incision. The rectus muscles were separated in the midline bluntly and the peritoneum was entered bluntly. A bladder flap was created with the Metzenbaum scissors and extended laterally with the same. Attention was turned to the lower uterine segment where a low transverse hysterotomy incision was made with a scalpel and extended bilaterally bluntly.  The infant was successfully delivered, the cord was clamped and cut and the infant was handed over to awaiting neonatology team. Uterine massage was then administered, and the placenta delivered and appeared macerated but intact with a two-vessel cord. The uterus was then cleared of clot and debris.  The hysterotomy was closed with 0 Vicryl in a running locked fashion, and an imbricating layer was also placed with the same suture. The pelvis was cleared of all clot and debris. Hemostasis was confirmed on all surfaces.  At this point Dr. Cletis Media, the patients's primary physician arrived and completed the case. I scrubbed out.  There were no immediate complications.  Continued op note by Dr Cletis Media  As I scrubbed Dr Ihor Dow out , I obtained the following report for the patient's  L&D nurse:  Patient went off the monitor to use the restroom at 19:53 and returned to her bed at 20:01 where profuse bleeding with possible SROM was noted. Gavin Pound CNM was called at 20:03 before rushing the patient to the [place] and calling Faculty physician on call in house to attend the emergent cesarean section. I received a call from CNM at 20:05  requesting me to come for urgent cesarean section with footling breech and 1 foot in the vagina. I was informed the patient was already in the OR with Faculty physician. Patient arrived in the OR at 20:05 and baby was  delivered at 20:11.  Continued op note:  Both paracolic gutters are cleaned. Both tubes and ovaries are assessed and normal. The pelvis is profusely irrigated with warm saline to confirm a satisfactory hemostasis.  Retractors and sponges are removed. Under fascia hemostasis is completed with cauterization. The fascia is then closed with 2 running sutures of 0 Vicryl meeting midline. The wound is irrigated with warm saline and hemostasis is completed with cauterization. The skin is closed with a subcuticular suture of 3-0 Monocryl and Steri-Strips.  Instrument and sponge count had not been completed prior to the procedure. Estimated blood loss is 700 cc.Post-op X-Ray is negative for any foreign body. The procedure is well tolerated by the patient who is taken to recovery room in a well and stable condition.  Female baby  was born at 20:11  and received an Apgar of 3  at 1 minute and 8 at 5 minutes with a cord pH of 7.295  Specimen: Placenta sent to pathology  FOB and family updated on the status of the patient as well as the chronology of events and the placental abruptio.   IRCVEL,FYBOFB A MD 11/22/20158:58 PM

## 2013-12-22 NOTE — Progress Notes (Signed)
MAU Addendum Note  Results for orders placed or performed during the hospital encounter of 12/22/13 (from the past 24 hour(s))  Urinalysis, Routine w reflex microscopic     Status: Abnormal   Collection Time: 12/22/13 11:40 AM  Result Value Ref Range   Color, Urine YELLOW YELLOW   APPearance CLEAR CLEAR   Specific Gravity, Urine <1.005 (L) 1.005 - 1.030   pH 7.0 5.0 - 8.0   Glucose, UA NEGATIVE NEGATIVE mg/dL   Hgb urine dipstick LARGE (A) NEGATIVE   Bilirubin Urine NEGATIVE NEGATIVE   Ketones, ur NEGATIVE NEGATIVE mg/dL   Protein, ur NEGATIVE NEGATIVE mg/dL   Urobilinogen, UA 0.2 0.0 - 1.0 mg/dL   Nitrite NEGATIVE NEGATIVE   Leukocytes, UA SMALL (A) NEGATIVE  Urine microscopic-add on     Status: Abnormal   Collection Time: 12/22/13 11:40 AM  Result Value Ref Range   Squamous Epithelial / LPF FEW (A) RARE   WBC, UA 7-10 <3 WBC/hpf   RBC / HPF 3-6 <3 RBC/hpf   Bacteria, UA FEW (A) RARE  Wet prep, genital     Status: Abnormal   Collection Time: 12/22/13 12:34 PM  Result Value Ref Range   Yeast Wet Prep HPF POC NONE SEEN NONE SEEN   Trich, Wet Prep NONE SEEN NONE SEEN   Clue Cells Wet Prep HPF POC NONE SEEN NONE SEEN   WBC, Wet Prep HPF POC FEW (A) NONE SEEN  Fetal fibronectin     Status: Abnormal   Collection Time: 12/22/13 12:34 PM  Result Value Ref Range   Fetal Fibronectin POSITIVE (A) NEGATIVE   [place] report: breech, anterior placenta, Cervical length 1.7cm, blood clot at the internal os  Plan: Consulted with Dr. Alesia Richards Admit to [facility]  IV and labs   [name] [name], CNM, MSN 12/22/2013. 1:49 PM

## 2013-12-22 NOTE — Plan of Care (Signed)
Problem: Phase II Progression Outcomes Goal: Fetal monitoring per orders Outcome: Completed/Met Date Met:  12/22/13 Goal: Regular diet Outcome: Completed/Met Date Met:  12/22/13

## 2013-12-22 NOTE — Progress Notes (Addendum)
Stat cesarean section from labor and delivery called at 2004. 2 circulating nurses and 2 scrub techs in OR at 2004. 4 labor and delivery nurses in OR to help prep patient. Anesthesia at bedside when patient entered OR at 2005. Allergies verified and patient verified when she entered the room at 2005.  Dr. Ihor Dow at bedside at 2006. Labor and Delivery RN reported to Dr. Ihor Dow the patient name, allergies and reason for cesarean section. Patient was  moved to OR table, foley placed, betadine poured on abdomen, NICU team called and patient was draped. Incision at 2010. Initial count not performed and consent not signed. Full time out by Dr. Ihor Dow not performed due to stat nature of surgery. Delivery of infant at 2011. Dr. Cletis Media in [place] at 2019 to relieve Dr. Ihor Dow. Xray performed after surgery since no count was done. Xray negative.

## 2013-12-22 NOTE — Transfer of Care (Signed)
Immediate Anesthesia Transfer of Care Note  Patient: [name] [name]  Procedure(s) Performed: Procedure(s): CESAREAN SECTION (N/A)  Patient Location: PACU  Anesthesia Type:General  Level of Consciousness: sedated  Airway & Oxygen Therapy: Patient Spontanous Breathing and Patient connected to nasal cannula oxygen  Post-op Assessment: Report given to PACU RN and Post -op Vital signs reviewed and stable  Post vital signs: stable  Complications: No apparent anesthesia complications

## 2013-12-22 NOTE — Progress Notes (Signed)
Hospital day # 0 pregnancy at [redacted]w[redacted]d--short cervix and ctx.  S:  Perception of contractions: none, regular, every 2-4 minutes      Vaginal bleeding: none now and passing small clots       Vaginal discharge:  no significant change  O: BP 139/91 mmHg  Pulse 108  Temp(Src) 97.9 F (36.6 C) (Oral)  Resp 18  Ht 5\' 4"  (1.626 m)  Wt 158 lb (71.668 kg)  BMI 27.11 kg/m2  SpO2 97%  LMP 05/16/2013      Fetal tracings: 135, medium to moderate varibility      Contractions:  2-4 minutes,  Mild to moderate       Uterus gravid and non-tender      Extremities: extremities normal, atraumatic, no cyanosis or edema and no significant edema and no signs of DVT          Labs:   Results for orders placed or performed during the hospital encounter of 12/22/13 (from the past 24 hour(s))  OB RESULT CONSOLE Group B Strep     Status: None   Collection Time: 12/22/13 12:00 AM  Result Value Ref Range   GBS Negative   Urinalysis, Routine w reflex microscopic     Status: Abnormal   Collection Time: 12/22/13 11:40 AM  Result Value Ref Range   Color, Urine YELLOW YELLOW   APPearance CLEAR CLEAR   Specific Gravity, Urine <1.005 (L) 1.005 - 1.030   pH 7.0 5.0 - 8.0   Glucose, UA NEGATIVE NEGATIVE mg/dL   Hgb urine dipstick LARGE (A) NEGATIVE   Bilirubin Urine NEGATIVE NEGATIVE   Ketones, ur NEGATIVE NEGATIVE mg/dL   Protein, ur NEGATIVE NEGATIVE mg/dL   Urobilinogen, UA 0.2 0.0 - 1.0 mg/dL   Nitrite NEGATIVE NEGATIVE   Leukocytes, UA SMALL (A) NEGATIVE  Urine microscopic-add on     Status: Abnormal   Collection Time: 12/22/13 11:40 AM  Result Value Ref Range   Squamous Epithelial / LPF FEW (A) RARE   WBC, UA 7-10 <3 WBC/hpf   RBC / HPF 3-6 <3 RBC/hpf   Bacteria, UA FEW (A) RARE  Wet prep, genital     Status: Abnormal   Collection Time: 12/22/13 12:34 PM  Result Value Ref Range   Yeast Wet Prep HPF POC NONE SEEN NONE SEEN   Trich, Wet Prep NONE SEEN NONE SEEN   Clue Cells Wet Prep HPF POC NONE SEEN  NONE SEEN   WBC, Wet Prep HPF POC FEW (A) NONE SEEN  Fetal fibronectin     Status: Abnormal   Collection Time: 12/22/13 12:34 PM  Result Value Ref Range   Fetal Fibronectin POSITIVE (A) NEGATIVE  CBC     Status: Abnormal   Collection Time: 12/22/13  2:22 PM  Result Value Ref Range   WBC 9.0 4.0 - 10.5 K/uL   RBC 3.71 (L) 3.87 - 5.11 MIL/uL   Hemoglobin 10.2 (L) 12.0 - 15.0 g/dL   HCT 32.1 (L) 36.0 - 46.0 %   MCV 86.5 78.0 - 100.0 fL   MCH 27.5 26.0 - 34.0 pg   MCHC 31.8 30.0 - 36.0 g/dL   RDW 20.0 (H) 11.5 - 15.5 %   Platelets 188 150 - 400 K/uL  Differential     Status: Abnormal   Collection Time: 12/22/13  2:22 PM  Result Value Ref Range   Neutrophils Relative % 81 (H) 43 - 77 %   Neutro Abs 7.3 1.7 - 7.7 K/uL   Lymphocytes Relative 12  12 - 46 %   Lymphs Abs 1.1 0.7 - 4.0 K/uL   Monocytes Relative 6 3 - 12 %   Monocytes Absolute 0.5 0.1 - 1.0 K/uL   Eosinophils Relative 1 0 - 5 %   Eosinophils Absolute 0.1 0.0 - 0.7 K/uL   Basophils Relative 0 0 - 1 %   Basophils Absolute 0.0 0.0 - 0.1 K/uL  Type and screen     Status: None   Collection Time: 12/22/13  2:22 PM  Result Value Ref Range   ABO/RH(D) O POS    Antibody Screen NEG    Sample Expiration 12/25/2013   Group B strep by PCR     Status: None   Collection Time: 12/22/13  4:00 PM  Result Value Ref Range   Group B strep by PCR NEGATIVE NEGATIVE         Meds: Current facility-administered medications: acetaminophen (TYLENOL) tablet 650 mg, 650 mg, Oral, Q4H PRN, Aaliyha Mumford, CNM;  [START ON 12/23/2013] betamethasone acetate-betamethasone sodium phosphate (CELESTONE) injection 12 mg, 12 mg, Intramuscular, Once, Corynne Scibilia, CNM;  calcium carbonate (TUMS - dosed in mg elemental calcium) chewable tablet 400 mg of elemental calcium, 2 tablet, Oral, Q4H PRN, Annastacia Duba, CNM docusate sodium (COLACE) capsule 100 mg, 100 mg, Oral, Daily, Neo Yepiz, CNM;  lactated ringers infusion, , Intravenous, Continuous, Elle Vezina, CNM, Last Rate: 125 mL/hr at 12/22/13 1621;  [START ON 12/23/2013] prenatal multivitamin tablet 1 tablet, 1 tablet, Oral, Q1200, Ariauna Farabee, CNM;  promethazine (PHENERGAN) injection 25 mg, 25 mg, Intravenous, Q6H PRN, Kemon Devincenzi, CNM zolpidem (AMBIEN) tablet 5 mg, 5 mg, Oral, QHS PRN, Arlyss Weathersby, CNM  A: [redacted]w[redacted]d with short cervix     gradually worsening     Fetal tracings: Category 4-[id number], medium to moderate varibility     Contractions: 2-4 minutes     Uterus non-tender      Extremities: DTR 1+, no clonus, no edema     P: Continue current plan of care      txr to L&D      Consult with Dr. Alesia Richards for plan on care      Per Dr Alesia Richards Phenergan 25mg  IV      MDs will follow Yaslyn Cumby, CNM, MSN 12/22/2013. 6:27 PM

## 2013-12-22 NOTE — MAU Provider Note (Signed)
[name] [name] is a 33 y.o. G1P0 at 31.3 weeks presents to MAU c/o lower abd pain since last night, and one spot when she wiped.  She denies LOF and had +FM.  She report she had intercourse yesterday morning.   History     Patient Active Problem List   Diagnosis Date Noted  . [redacted] weeks gestation of pregnancy   . Elderly primigravida, antepartum 10/24/2013  . Single umbilical artery, maternal, antepartum 10/24/2013    Chief Complaint  Patient presents with  . Abdominal Pain   HPI  OB History    Gravida Para Term Preterm AB TAB SAB Ectopic Multiple Living   1               Past Medical History  Diagnosis Date  . Endometriosis   . Fibroids     uterine  . Elevated LFTs   . Depression   . Restless leg syndrome   . Anemia   . Anxiety   . Kidney stone     Past Surgical History  Procedure Laterality Date  . Diagnostic laparoscopy  2010  . Femoral hernia repair      right    Family History  Problem Relation Age of Onset  . Anesthesia problems Neg Hx   . Hypotension Neg Hx   . Pseudochol deficiency Neg Hx   . Malignant hyperthermia Neg Hx   . Colon cancer Father   . Colon polyps Father     History  Substance Use Topics  . Smoking status: Never Smoker   . Smokeless tobacco: Never Used  . Alcohol Use: No    Allergies:  Allergies  Allergen Reactions  . Penicillins Other (See Comments)    Childhood allergy;reaction unknown    Prescriptions prior to admission  Medication Sig Dispense Refill Last Dose  . escitalopram (LEXAPRO) 10 MG tablet Take 10 mg by mouth daily.     Not Taking  . HYDROcodone-acetaminophen (NORCO/VICODIN) 5-325 MG per tablet Take 2 tablets by mouth every 4 (four) hours as needed. 10 tablet 0 Not Taking  . levofloxacin (LEVAQUIN) 750 MG tablet Take 1 tablet (750 mg total) by mouth daily. 7 tablet 0 Not Taking  . levothyroxine (SYNTHROID, LEVOTHROID) 100 MCG tablet Take 100 mcg by mouth daily before breakfast.   Taking  . omeprazole  (PRILOSEC) 20 MG capsule Take one capsule by mouth one time daily 30 capsule 3 Taking  . ondansetron (ZOFRAN ODT) 4 MG disintegrating tablet Take 1 tablet (4 mg total) by mouth every 8 (eight) hours as needed for nausea or vomiting. 20 tablet 0 Taking  . Prenatal Multivit-Min-Fe-FA (PRENATAL VITAMINS PO) Take by mouth.   Taking  . Probiotic Product (PROBIOTIC DAILY PO) Take 1 tablet by mouth daily.   Taking  . Sertraline HCl (ZOLOFT PO) Take by mouth.   Taking    ROS See HPI above, all other systems are negative  Physical Exam   Blood pressure 123/72, pulse 69, temperature 97.9 F (36.6 C), temperature source Oral, resp. rate 18, height 5\' 4"  (1.626 m), weight 158 lb (71.668 kg), last menstrual period 05/16/2013.  Physical Exam Ext:  WNL ABD: Soft, non tender to palpation, no rebound or guarding SVE:   ED Course  Assessment: IUP at  31.3 weeks Membranes: intact FHR: Category 140, minimum variability, + accel CTX:  occassional   Plan: Wetprep, GC/CL, FFN OB US Give Apple juice    [employer], CNM, MSN 12/22/2013. 12:11 PM

## 2013-12-22 NOTE — Anesthesia Postprocedure Evaluation (Signed)
Anesthesia Post Note  Patient: [name] [name]  Procedure(s) Performed: Procedure(s) (LRB): CESAREAN SECTION (N/A)  Anesthesia type: General  Patient location: PACU  Post pain: Pain level controlled  Post assessment: Post-op Vital signs reviewed  Last Vitals: BP 141/95 mmHg  Pulse 80  Temp(Src) 36.4 C (Oral)  Resp 23  Ht 5\' 4"  (1.626 m)  Wt 158 lb (71.668 kg)  BMI 27.11 kg/m2  SpO2 99%  LMP 05/16/2013  Breastfeeding? Unknown  Post vital signs: Reviewed  Level of consciousness: sedated  Complications: No apparent anesthesia complications

## 2013-12-22 NOTE — Plan of Care (Signed)
Problem: Phase I Progression Outcomes Goal: Assess per MD/Nurse,Routine-VS,FHR,UC,Head to Toe assess Outcome: Completed/Met Date Met:  12/22/13 Goal: Obtain and review prenatal records Outcome: Completed/Met Date Met:  12/22/13 Goal: Pain controlled with appropriate interventions Outcome: Completed/Met Date Met:  12/22/13 Goal: OOB as tolerated unless otherwise ordered Outcome: Completed/Met Date Met:  12/22/13 Goal: IV Pain medications as ordered Outcome: Completed/Met Date Met:  12/22/13

## 2013-12-22 NOTE — H&P (Signed)
[name] [name] is a 33 y.o. female, G1 P0 at 31.3 weeks, short cervix, breech, blood clot at the os  Patient Active Problem List   Diagnosis Date Noted  . [redacted] weeks gestation of pregnancy   . Elderly primigravida, antepartum 10/24/2013  . Single umbilical artery, maternal, antepartum 10/24/2013    Pregnancy Course: Patient transferedcare at 20.1 weeks.   EDC of 02/20/14 was established by [place] on 06/26/13.      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 Endometriosis   . Fibroids     uterine  . Elevated LFTs   . Depression   . Restless leg syndrome   . Anemia   . Anxiety   . Kidney stone    Past Surgical History  Procedure Laterality Date  . Diagnostic laparoscopy  2010  . Femoral hernia repair      right   Family History: family history includes Colon cancer  in her father; Colon polyps in her father. There is no history of Anesthesia problems, Hypotension, Pseudochol deficiency, or Malignant hyperthermia. Social History:  reports that she has never smoked. She has never used smokeless tobacco. She reports that she does not drink alcohol or use illicit drugs.   Prenatal Transfer Tool  Maternal Diabetes: No Genetic Screening: Declined Maternal Ultrasounds/Referrals: Abnormal:  Findings:   Other:2 vessel cord Fetal Ultrasounds or other Referrals:  Other: declined Maternal Substance Abuse:  No Significant Maternal Medications:  Meds include: Zoloft Protonix Syntroid Significant Maternal Lab Results: Lab values include: Other: HGB 9.7, TSH 6.55 06/25/13, TSH 3.81 10/25/13   ROS:  See HPI above, all other systems are negative  Allergies  Allergen Reactions  . Penicillins Other (See Comments)    Childhood allergy;reaction unknown    Dilation: Closed Exam by:: Mylon Mabey Blood pressure 123/72, pulse 69, temperature 97.9 F (36.6 C), temperature source Oral, resp. rate 18, height 5\' 4"  (1.626 m), weight 158 lb (71.668 kg), last menstrual period 05/16/2013.  Maternal Exam:  Uterine Assessment: Contraction frequency is occassional.  Abdomen: Gravid, non tender. Fundal height is aga.  Normal external genitalia, vulva, cervix,  uterus and adnexa.  No lesions noted on exam.  Pelvis adequate for delivery.  Fetal presentation: breech by [place] on 12/22/13  Fetal Exam:  Monitor Surveillance : Continuous Monitoring   Mode: Ultrasound.  NICHD: Category 2, occasional minimum variability CTXs: Q occasional EFW    Physical Exam: Nursing note and vitals reviewed General: alert and cooperative She appears well nourished Psychiatric: Normal mood and affect. Her behavior is normal Head: Normocephalic Eyes: Pupils are equal, round, and reactive to light Neck: Normal range of motion Cardiovascular: RRR without murmur  Respiratory: CTAB. Effort normal   Abd: soft, non-tender, +BS, no rebound, no guarding  Genitourinary: Vagina normal  Neurological: A&Ox3 Skin: Warm and dry  Musculoskeletal: Normal range of motion  Homan's sign negative bilaterally No evidence of DVTs.  Edema: no edema DTR: 2+ Clonus: None   Prenatal labs: ABO, Rh:  O positive Antibody:  negative Rubella:    RPR:   NR HBsAg:   negative HIV:   negative GBS:  unknown Sickle cell/Hgb electrophoresis:  WNL Pap:  wnl 08/05/13 GC:   negative Chlamydia: negative Genetic screenings:   Glucola:    All information will be confirmed upon admisson  Assessment:  IUP at 31.3 weeks NICHD: Category 2 Membranes:intact GBS pending Diagnosis:  Preterm labor  Plan:  Admit to Antepartum unit Routing [facility] AP orders IV pain medication per orders Prophylaxis abx if necessary Diet: clear   Antonya Leeder, CNM, MSN 12/22/2013, 2:59 PM

## 2013-12-22 NOTE — Consult Note (Addendum)
Neonatology Note:   Attendance at C-section:    I was asked by Dr. Ihor Dow (for [place]) to attend this Stat C/S under general anesthesia at 31 3/7 weeks due to placental abruption. The mother is a G1P0 O pos, GBS neg with short cervix, endometriosis, uterine fibroids, anxiety and depression (on Zoloft). She is hypothyroid, on Synthroid. She suddenly starting having profuse vaginal bleeding and a stat C/S was called. ROM at delivery, fluid bloody. Infant delivered breech and was flaccid, blue, and apneic at birth, with a HR about 60. With bulb suctioning, she had some response. We got bright red blood from her mouth and nares, and the thin cord was stained pink. She did not respond to stimulation, so I applied PPV. Her HR came up promptly and color improved. We placed a pulse oximeter. At about 3 minutes, I stopped PPV, but she continued to be apneic. Resumed PPV. I intubated her at 4 minutes of life with a 3.0 mm ETT on the first attempt, to a depth of 8 cm at the lips. The CO2 detector turned yellow immediately and breath sounds could be heard bilaterally, although they were coarse and left was louder than right. We secured the ETT and adjusted the FIO2 to 50% to keep O2 saturations within expected parameters for age. Ap 3/8. We transported the baby to the NICU, accompanied by her father en route.   Real Cons, MD

## 2013-12-22 NOTE — Plan of Care (Signed)
Problem: Consults Goal: Automotive engineer Patient Education (See Patient Education module for education specifics.) Will resolve once recovering in post-partum. Not done on labor and delivery due to stat c-section Goal: NICU Tour Outcome: Not Met (add Reason)

## 2013-12-22 NOTE — MAU Note (Signed)
Pt presents to MAU with complaints of lower abdominal pain since last night which is constant. Denies any vaginal bleeding or LOF

## 2013-12-22 NOTE — Anesthesia Preprocedure Evaluation (Signed)
Anesthesia Evaluation  Preop documentation limited or incomplete due to emergent nature of procedure.  Airway        Dental   Pulmonary          Cardiovascular     Neuro/Psych    GI/Hepatic   Endo/Other    Renal/GU      Musculoskeletal   Abdominal   Peds  Hematology   Anesthesia Other Findings   Reproductive/Obstetrics                             Anesthesia Physical Anesthesia Plan  ASA: II and emergent  Anesthesia Plan: General   Post-op Pain Management:    Induction: Intravenous, Rapid sequence and Cricoid pressure planned  Airway Management Planned: Oral ETT and Video Laryngoscope Planned  Additional Equipment:   Intra-op Plan:   Post-operative Plan: Extubation in OR  Informed Consent: I have reviewed the patients History and Physical, chart, labs and discussed the procedure including the risks, benefits and alternatives for the proposed anesthesia with the patient or authorized representative who has indicated his/her understanding and acceptance.   Dental advisory given  Plan Discussed with: CRNA  Anesthesia Plan Comments:         Anesthesia Quick Evaluation

## 2013-12-23 ENCOUNTER — Encounter (HOSPITAL_COMMUNITY): Payer: Self-pay | Admitting: Obstetrics & Gynecology

## 2013-12-23 DIAGNOSIS — O4590 Premature separation of placenta, unspecified, unspecified trimester: Secondary | ICD-10-CM | POA: Diagnosis present

## 2013-12-23 LAB — CBC
HCT: 31.4 % — ABNORMAL LOW (ref 36.0–46.0)
Hemoglobin: 9.9 g/dL — ABNORMAL LOW (ref 12.0–15.0)
MCH: 27.6 pg (ref 26.0–34.0)
MCHC: 31.5 g/dL (ref 30.0–36.0)
MCV: 87.5 fL (ref 78.0–100.0)
PLATELETS: 201 10*3/uL (ref 150–400)
RBC: 3.59 MIL/uL — ABNORMAL LOW (ref 3.87–5.11)
RDW: 20.3 % — ABNORMAL HIGH (ref 11.5–15.5)
WBC: 13.2 10*3/uL — ABNORMAL HIGH (ref 4.0–10.5)

## 2013-12-23 LAB — RUBELLA SCREEN: Rubella: 22.9 Index — ABNORMAL HIGH (ref <0.90)

## 2013-12-23 LAB — RPR

## 2013-12-23 MED ORDER — MENTHOL 3 MG MT LOZG: 1.0000 | LOZENGE | OROMUCOSAL | Status: DC | PRN

## 2013-12-23 MED ORDER — FERROUS SULFATE 325 (65 FE) MG PO TABS
325.0000 mg | ORAL_TABLET | Freq: Two times a day (BID) | ORAL | Status: DC
  Administered 2013-12-23 – 2013-12-25 (×6): 325 mg via ORAL
  Filled 2013-12-23 (×6): qty 1

## 2013-12-23 MED ORDER — SIMETHICONE 80 MG PO CHEW
80.0000 mg | CHEWABLE_TABLET | ORAL | Status: DC
  Administered 2013-12-23 – 2013-12-24 (×2): 80 mg via ORAL
  Filled 2013-12-23 (×2): qty 1

## 2013-12-23 MED ORDER — PRENATAL MULTIVITAMIN CH
1.0000 | ORAL_TABLET | Freq: Every day | ORAL | Status: DC
  Administered 2013-12-23 – 2013-12-25 (×3): 1 via ORAL
  Filled 2013-12-23 (×3): qty 1

## 2013-12-23 MED ORDER — LANOLIN HYDROUS EX OINT: 1.0000 "application " | TOPICAL_OINTMENT | CUTANEOUS | Status: DC | PRN

## 2013-12-23 MED ORDER — SIMETHICONE 80 MG PO CHEW: 80.0000 mg | CHEWABLE_TABLET | ORAL | Status: DC | PRN

## 2013-12-23 MED ORDER — ZOLPIDEM TARTRATE 5 MG PO TABS: 5.0000 mg | ORAL_TABLET | Freq: Every evening | ORAL | Status: DC | PRN

## 2013-12-23 MED ORDER — LACTATED RINGERS IV SOLN
INTRAVENOUS | Status: DC
  Administered 2013-12-23: 01:00:00 via INTRAVENOUS

## 2013-12-23 MED ORDER — OXYTOCIN 40 UNITS IN LACTATED RINGERS INFUSION - SIMPLE MED: 62.5000 mL/h | INTRAVENOUS | Status: AC

## 2013-12-23 MED ORDER — METHYLERGONOVINE MALEATE 0.2 MG/ML IJ SOLN: 0.2000 mg | INTRAMUSCULAR | Status: DC | PRN

## 2013-12-23 MED ORDER — MEASLES, MUMPS & RUBELLA VAC ~~LOC~~ INJ
0.5000 mL | INJECTION | Freq: Once | SUBCUTANEOUS | Status: DC
  Filled 2013-12-23: qty 0.5

## 2013-12-23 MED ORDER — METHYLERGONOVINE MALEATE 0.2 MG PO TABS
0.2000 mg | ORAL_TABLET | ORAL | Status: DC | PRN
Start: 2013-12-23 — End: 2013-12-25

## 2013-12-23 MED ORDER — DIPHENHYDRAMINE HCL 25 MG PO CAPS: 25.0000 mg | ORAL_CAPSULE | Freq: Four times a day (QID) | ORAL | Status: DC | PRN

## 2013-12-23 MED ORDER — OXYCODONE-ACETAMINOPHEN 5-325 MG PO TABS
1.0000 | ORAL_TABLET | ORAL | Status: DC | PRN
  Administered 2013-12-23 – 2013-12-24 (×3): 1 via ORAL
  Filled 2013-12-23 (×3): qty 1

## 2013-12-23 MED ORDER — SIMETHICONE 80 MG PO CHEW
80.0000 mg | CHEWABLE_TABLET | Freq: Three times a day (TID) | ORAL | Status: DC
  Administered 2013-12-23 – 2013-12-25 (×9): 80 mg via ORAL
  Filled 2013-12-23 (×9): qty 1

## 2013-12-23 MED ORDER — IBUPROFEN 600 MG PO TABS
600.0000 mg | ORAL_TABLET | Freq: Four times a day (QID) | ORAL | Status: DC
  Administered 2013-12-23 – 2013-12-25 (×12): 600 mg via ORAL
  Filled 2013-12-23 (×12): qty 1

## 2013-12-23 MED ORDER — SENNOSIDES-DOCUSATE SODIUM 8.6-50 MG PO TABS
2.0000 | ORAL_TABLET | ORAL | Status: DC
  Administered 2013-12-23 – 2013-12-24 (×2): 2 via ORAL
  Filled 2013-12-23 (×2): qty 2

## 2013-12-23 MED ORDER — ONDANSETRON HCL 4 MG PO TABS: 4.0000 mg | ORAL_TABLET | ORAL | Status: DC | PRN

## 2013-12-23 MED ORDER — OXYCODONE-ACETAMINOPHEN 5-325 MG PO TABS: 2.0000 | ORAL_TABLET | ORAL | Status: DC | PRN

## 2013-12-23 MED ORDER — TETANUS-DIPHTH-ACELL PERTUSSIS 5-2.5-18.5 LF-MCG/0.5 IM SUSP: 0.5000 mL | Freq: Once | INTRAMUSCULAR | Status: DC

## 2013-12-23 MED ORDER — ONDANSETRON HCL 4 MG/2ML IJ SOLN: 4.0000 mg | INTRAMUSCULAR | Status: DC | PRN

## 2013-12-23 MED ORDER — WITCH HAZEL-GLYCERIN EX PADS: 1.0000 "application " | MEDICATED_PAD | CUTANEOUS | Status: DC | PRN

## 2013-12-23 MED ORDER — DIBUCAINE 1 % RE OINT: 1.0000 "application " | TOPICAL_OINTMENT | RECTAL | Status: DC | PRN

## 2013-12-23 NOTE — Plan of Care (Signed)
Problem: Phase II Progression Outcomes Goal: Pain controlled on oral analgesia Outcome: Completed/Met Date Met:  12/23/13

## 2013-12-23 NOTE — Addendum Note (Signed)
Addendum  created 12/23/13 0739 by Talbot Grumbling, CRNA   Modules edited: Notes Section   Notes Section:  File: [id number]

## 2013-12-23 NOTE — Progress Notes (Signed)
I visited with Ms [name] [name], her husband, and several members of her family.  Pt is worried about her baby and reported that seeing her baby for the first time was very emotional.  This has been a difficult pregnancy and she was aware that her baby might come early, but she did not expect her baby to be his early.  She was not aware that her baby would be separated from her when born this early, nor was she aware that the NICU cared for babies even earlier than her baby was born.  She has very good support from her family, many of whom live locally. Pt has been in the [place] for 15 years, though her husband has only been here 3 years.  His family is in [place].   We will continue to follow up with family throughout their time in the hospital, but please also page as needs arise.  [employer] Pager, [phone] 2:45 PM    12/23/13 1400  Clinical Encounter Type  Visited With Patient;Patient and family together  Visit Type Spiritual support  Spiritual Encounters  Spiritual Needs Emotional

## 2013-12-23 NOTE — Plan of Care (Signed)
Problem: Phase I Progression Outcomes Goal: Pain controlled with appropriate interventions Outcome: Completed/Met Date Met:  12/23/13 Goal: Foley catheter patent Outcome: Completed/Met Date Met:  12/23/13 Goal: IS, TCDB as ordered Outcome: Completed/Met Date Met:  12/23/13

## 2013-12-23 NOTE — Progress Notes (Addendum)
Subjective: Postpartum Day 1: Cesarean Delivery due to malpresentation, possible abruption,stat C/S under general. Case started by Dr. Lady Saucier for Dr. Cletis Media. Patient up to BR x 1 for foley removal at 0614, reports no syncope or dizziness. No spontaneous void yet. Feeding:  Planning breastfeeding Contraceptive plan:  Undecided  Baby in NICU, on vent, but overall stable.  Patient has been down to see baby x 1.  Dr. Alesia Richards in with me to see the patient.  Objective: Vital signs in last 24 hours: Temp:  [97.5 F (36.4 C)-98.7 F (37.1 C)] 98.1 F (36.7 C) (11/23 0500) Pulse Rate:  [68-110] 68 (11/23 0500) Resp:  [12-33] 17 (11/23 0500) BP: (111-154)/(70-100) 111/70 mmHg (11/23 0500) SpO2:  [95 %-99 %] 98 % (11/23 0500) Weight:  [158 lb (71.668 kg)] 158 lb (71.668 kg) (11/22 1159)  Physical Exam:  General: alert Lochia: appropriate Uterine Fundus: firm Abdomen:  + bowel sounds, mild gaseous distention Incision: Honeycomb dressing CDI DVT Evaluation: No evidence of DVT seen on physical exam. Negative Homan's sign. Foley had been draining clear urine prior to removal.   Recent Labs  12/22/13 1422 12/23/13 0635  HGB 10.2* 9.9*  HCT 32.1* 31.4*  WBC 9.0 13.2*    Assessment/Plan: Status post Cesarean section day 1--malpresentation, possible abruption, stat C/S under general. Doing well postoperatively.  Continue current care. Support to patient regarding NICU infant   Donnel Saxon CNM, [place] 12/23/2013, 8:44 AM  LABS TODAY: Hgb 9.9 plt 201.  I saw and examined patient with CNM Donnel Saxon and agree with above findings, assessment and plan.

## 2013-12-23 NOTE — Plan of Care (Signed)
Problem: Consults Goal: Postpartum Patient Education (See Patient Education module for education specifics.)  Outcome: Completed/Met Date Met:  12/23/13  Problem: Phase I Progression Outcomes Goal: Voiding adequately Outcome: Completed/Met Date Met:  12/23/13 Goal: Other Phase I Outcomes/Goals Outcome: Not Applicable Date Met:  96/04/54  Problem: Phase II Progression Outcomes Goal: Progress activity as tolerated unless otherwise ordered Outcome: Completed/Met Date Met:  12/23/13 Goal: Afebrile, VS remain stable Outcome: Completed/Met Date Met:  12/23/13 Goal: Incision intact & without signs/symptoms of infection Outcome: Completed/Met Date Met:  12/23/13 Goal: Rh isoimmunization per orders Outcome: Not Applicable Date Met:  09/81/19 Goal: Tolerating diet Outcome: Completed/Met Date Met:  12/23/13 Goal: Other Phase II Outcomes/Goals Outcome: Not Applicable Date Met:  14/78/29

## 2013-12-23 NOTE — Progress Notes (Signed)
Ur chart review completed.  

## 2013-12-23 NOTE — Plan of Care (Signed)
Problem: Consults Goal: Postpartum Patient Education (See Patient Education module for education specifics.)  Outcome: Progressing  Problem: Phase I Progression Outcomes Goal: OOB as tolerated unless otherwise ordered Outcome: Completed/Met Date Met:  12/23/13 Goal: VS, stable, temp < 100.4 degrees F Outcome: Completed/Met Date Met:  12/23/13 Goal: Initial discharge plan identified Outcome: Completed/Met Date Met:  12/23/13

## 2013-12-23 NOTE — Anesthesia Postprocedure Evaluation (Signed)
Anesthesia Post Note  Patient: [name] [name]  Procedure(s) Performed: Procedure(s) (LRB): CESAREAN SECTION (N/A)  Anesthesia type: General  Patient location: Mother/Baby  Post pain: Pain level controlled  Post assessment: Post-op Vital signs reviewed  Last Vitals:  Filed Vitals:   12/23/13 0500  BP: 111/70  Pulse: 68  Temp: 36.7 C  Resp: 17    Post vital signs: Reviewed  Level of consciousness: awake and alert   Complications: No apparent anesthesia complications

## 2013-12-23 NOTE — Lactation Note (Addendum)
This note was copied from the chart of [name] [place]. Lactation Consultation Note  Mother recently pumped and stated she has no milk.  Encouraged mother.  Noted rings from pressing too hard with pump cups. Reviewed instead to turn up suction and how to use pump settings, milk storage, labeling and cleaning. Demonstrated hand expression on both breasts.  Small amount of colostrum expressed.  Encouraged mother to hand express before and after pumping. Plan is for mother to pump every 3 hours for 15-20 min.  Encouraged mother to read NICU booklet.  Highlighted pumping schedule and milk transportation. Mother has [place].  Discussed getting [facility]BP from [facility].  Faxed for to [facility]. Provided parents with [facility] loaner packet to complete for discharge.  Patient Name: [name] [name] Today's Date: 12/23/2013 Reason for consult: Initial assessment   Maternal Data Has patient been taught Hand Expression?: Yes Does the patient have breastfeeding experience prior to this delivery?: No  Feeding    LATCH Score/Interventions                      Lactation Tools Discussed/Used Pump Review: Setup, frequency, and cleaning;Milk Storage   Consult Status Consult Status: Follow-up Date: 12/24/13 Follow-up type: In-patient    Vivianne Master [facility] 12/23/2013, 9:47 AM

## 2013-12-24 LAB — GC/CHLAMYDIA PROBE AMP
CT Probe RNA: NEGATIVE
GC Probe RNA: NEGATIVE

## 2013-12-24 NOTE — Progress Notes (Signed)
Subjective: Postpartum Day 2: Cesarean Delivery due to malpresentation, possible abruption,stat C/S under general. Case started by Dr. Lady Saucier for Dr. Cletis Media. Patient up ad lib, reports no syncope or dizziness. +Flatus Feeding:  pumping Contraceptive plan:  unsure  Objective: Vital signs in last 24 hours: Temp:  [98.1 F (36.7 C)-98.5 F (36.9 C)] 98.1 F (36.7 C) (11/24 0544) Pulse Rate:  [54-66] 64 (11/24 0544) Resp:  [16] 16 (11/24 0544) BP: (96-108)/(48-68) 103/61 mmHg (11/24 0544) SpO2:  [97 %-98 %] 97 % (11/24 0544)  Physical Exam:  General: alert and cooperative Lochia: appropriate Uterine Fundus: firm Abdomen:  + bowel sounds, non distended Incision: healing well  Honeycomb dressing CDI DVT Evaluation: No evidence of DVT seen on physical exam. Homan's sign: Negative   Recent Labs  12/22/13 1422 12/23/13 0635  HGB 10.2* 9.9*  HCT 32.1* 31.4*  WBC 9.0 13.2*    Assessment: Status post Cesarean section day 2. Doing well postoperatively.  Honeycomb dressing in place, no significant drainage Anemia - hemodynamicly stable.   Plan: Continue current care. Plan for discharge tomorrow, Breastfeeding and Lactation consult     Jibril Mcminn, CNM, MSN 12/24/2013. 10:21 AM

## 2013-12-24 NOTE — Plan of Care (Signed)
Problem: Discharge Progression Outcomes Goal: Barriers To Progression Addressed/Resolved Outcome: Completed/Met Date Met:  12/24/13 Goal: Tolerating diet Outcome: Completed/Met Date Met:  61/47/09 Goal: Complications resolved/controlled Outcome: Completed/Met Date Met:  12/24/13 Goal: Pain controlled with appropriate interventions Outcome: Completed/Met Date Met:  12/24/13 Goal: MMR given as ordered Outcome: Not Applicable Date Met:  29/57/47

## 2013-12-24 NOTE — Lactation Note (Signed)
This note was copied from the chart of [name] [place]. Lactation Consultation Note     Follow up consult with this mom of a NICU baby,now 68 hours old, and 31 5/7 weeks CGA. Mom has been pumping every 3 hours, and I showed mom how to hand express, and mom was able to expressed about 0.5 mls of colostrum. Mom to rent a [facility] loaner tomorrow, and paper work started.  Patient Name: [name] [name] Date: 12/24/2013 Reason for consult: Follow-up assessment   Maternal Data    Feeding Feeding Type: Breast Milk with Formula added  LATCH Score/Interventions                      Lactation Tools Discussed/Used WIC Program: Yes Pump Review: Setup, frequency, and cleaning;Milk Storage;Other (comment) (settings, hand expression) Initiated by:: bedside rn   Consult Status Consult Status: Follow-up Date: 12/25/13 Follow-up type: In-patient    [name] 12/24/2013, 6:23 PM

## 2013-12-24 NOTE — Progress Notes (Signed)
Clinical Social Work Department PSYCHOSOCIAL ASSESSMENT - MATERNAL/CHILD 12/24/2013  Patient:  MercedesReeves  Account Number:  [id number]  Admit Date:  12/22/2013  Childs Name:   [name] [name]    Clinical Social Worker:  Dmya Long, LCSW   Date/Time:  12/24/2013 03:00 PM  Date Referred:        Other referral source:   No referral-NICU admission    I:  FAMILY / HOME ENVIRONMENT Child's legal guardian:  PARENT  Guardian - Name Guardian - Age Guardian - Address  [name] [name] 33 [address] [place],  [address]  [name] [name]  same   Other household support members/support persons Other support:   Good supports    II  PSYCHOSOCIAL DATA Information Source:  Patient Interview  Financial and Community Resources Employment:   MOB works for [employer]  FOB works for the [employer]   Financial resources:  Medicaid If Medicaid - County:  [place]  School / Grade:   Maternity Care Coordinator / Child Services Coordination / Early Interventions:   [facility]  Cultural issues impacting care:   MOB speaks English fluently, but it is not her first language.    III  STRENGTHS Strengths  Adequate Resources  Compliance with medical plan  Supportive family/friends  Understanding of illness   Strength comment:    IV  RISK FACTORS AND CURRENT PROBLEMS Current Problem:  YES   Risk Factor & Current Problem Patient Issue Family Issue Risk Factor / Current Problem Comment  Mental Illness Y N Hx of Anx/Dep   N N     V  SOCIAL WORK ASSESSMENT  CSW met with MOB in her third floor room/305 to introduce myself, complete assessment due to baby's admission to NICU at 31.3 weeks and to offer support.  MOB was very pleasant and welcoming.  She was quiet, but talkative.  She was very open to talking about her emotional reactions to the situation.  She states she is doing better and that it helps to see baby improving.  She states that baby's premature  arrival was completely unexpected and the shock has not warn off.  She reports feeling "scared" when she felt pain, which turned out to be labor at 31 weeks.  She reports coping well at this time, but is concerned about not being able to remain in the hospital with baby and admits to experiencing anxiety and depression in the past.  CSW validated her feelings and discussed common emotions in the days following the birth of a baby and also in an unexpected separation from baby.  CSW also provided education on PPD and asked that MOB talk with CSW and or her doctor if she has concerns about her emotions at any time.  MOB seemed genuinely appreciative of CSW's concerns for MOB's emotional well-being.  She states she is Zoloft 25mg.  CSW explained that if MOB feels this is not helping with symptoms of depression, she may want to consider talking with her doctor about increasing her dose, as this is a low dose of the medication.  She stated understanding.  CSW ensured that MOB knows that this medication is safe for breast feeding and that MOB's mental health is of utmost importance.  CSW asked if MOB has a counselor to talk with if needed.  She states her previous counselor retired, but that she has a close friend who has a degree in counseling who she can talk with any time.  CSW encouraged MOB to allow herself   to be emotional, but to keep a check on symptoms and talk with someone if she has concerns.  MOB agreed.  MOB thanked CSW for the visit.    VI SOCIAL WORK PLAN Social Work Plan  Psychosocial Support/Ongoing Assessment of Needs  Patient/Family Education   Type of pt/family education:   Ongoing support services offered by NICU CSW  PPD signs and symptoms   If child protective services report - county:   If child protective services report - date:   Information/referral to community resources comment:   No referral needs noted at this time-MOB seems well aware of emotional stressors and is linked with  counseling resources and medication management.   Other social work plan:     

## 2013-12-25 MED ORDER — IBUPROFEN 600 MG PO TABS: 600.0000 mg | ORAL_TABLET | Freq: Four times a day (QID) | ORAL | Status: DC

## 2013-12-25 MED ORDER — OXYCODONE-ACETAMINOPHEN 5-325 MG PO TABS
1.0000 | ORAL_TABLET | ORAL | Status: DC | PRN
Start: 2013-12-25 — End: 2016-01-15

## 2013-12-25 NOTE — Discharge Summary (Signed)
Cesarean Section Delivery Discharge Summary  [name] [name]  DOB:    1980/12/27 MRN:    [id number] CSN:    [id number]  Date of admission:                  12/22/13  Date of discharge:                   12/25/13  Procedures this admission:  emergency CS  Date of Delivery: 12/22/13  Newborn Data:  Live born  Information for the patient's newborn:  Lexiana, Spindel [[id number]]  female Live born female  Birth Weight: 3 lb 2.1 oz (1420 g) APGAR: 3, 8  Baby in NICU   History of Present Illness:  Ms. [name] [name] is a 33 y.o. female, G1P0101, who presents at [redacted]w[redacted]d weeks gestation. The patient has been followed at the [facility] and Gynecology division of [facility].    Her pregnancy has been complicated by:  Patient Active Problem List   Diagnosis Date Noted  . Placenta abruptio 12/23/2013  . [redacted] weeks gestation of pregnancy   . Elderly primigravida, antepartum 10/24/2013  . Single umbilical artery, maternal, antepartum 10/24/2013    Hospital course: The patient was admitted for bleeding and +ffn.   Her postpartum course was not complicated.  She was discharged to home on postpartum day 3 doing well.  Feeding: breast  Contraception: unsure   Discharge hemoglobin: HEMOGLOBIN  Date Value Ref Range Status  12/23/2013 9.9* 12.0 - 15.0 g/dL Final   HCT  Date Value Ref Range Status  12/23/2013 31.4* 36.0 - 46.0 % Final   Anemia - hemodynamicly stable.  PreNatal Labs ABO, Rh: --/--/O POS, O POS (11/22 1422)   Antibody: NEG (11/22 1422) Rubella:   immune RPR: NON REAC (11/22 1422)  HBsAg: Negative (06/25 0000)  HIV: Non-reactive (06/25 0000)  GBS: Negative (11/22 0000)  Discharge Physical Exam:  General: alert and cooperative Lochia: appropriate Uterine Fundus: firm Incision: healing well DVT Evaluation: No evidence of DVT seen on physical exam.  Intrapartum Procedures: spontaneous vaginal delivery and cesarean: low  cervical, transverse Postpartum Procedures: none Complications-Operative and Postpartum: none  Discharge Diagnoses: Preterm delivery SP abruption   Activity:           pelvic rest Diet:                routine Medications: PNV, Ibuprofen, Iron and Percocet Condition:      stable   Discharge to: home  Follow-up Information    Follow up with [facility]. Schedule an appointment as soon as possible for a visit in 2 weeks.   Specialty:  Obstetrics and Gynecology   Why:  For wound re-check   Contact information:   [address] [place] [address] [phone]      Follow up with [facility]. Schedule an appointment as soon as possible for a visit in 6 weeks.   Specialty:  Obstetrics and Gynecology   Why:  Postpartum check up, Call with any questions or concerns   Contact information:   [address] [place] [address] [place], CNM, MSN  12/25/2013. 9:41 AM  Care After Cesarean Delivery  Refer to this sheet in the next few weeks. These instructions provide you with information on caring for yourself after your procedure. Your caregiver may also give you specific instructions. Your  treatment has been planned according to current medical practices, but problems sometimes occur. Call your caregiver if you have any problems or questions after you go home. HOME CARE INSTRUCTIONS  Only take over-the-counter or prescription medicines as directed by your caregiver.  Do not drink alcohol, especially if you are breastfeeding or taking medicine to relieve pain.  Do not chew or smoke tobacco.  Continue to use good perineal care. Good perineal care includes:  Wiping your perineum from front to back.  Keeping your perineum clean.  Check your cut (incision) daily for increased redness, drainage, swelling, or separation of  skin.  Clean your incision gently with soap and water every day, and then pat it dry. If your caregiver says it is okay, leave the incision uncovered. Use a bandage (dressing) if the incision is draining fluid or appears irritated. If the adhesive strips across the incision do not fall off within 7 days, carefully peel them off.  Hug a pillow when coughing or sneezing until your incision is healed. This helps to relieve pain.  Do not use tampons or douche until your caregiver says it is okay.  Shower, wash your hair, and take tub baths as directed by your caregiver.  Wear a well-fitting bra that provides breast support.  Limit wearing support panties or control-top hose.  Drink enough fluids to keep your urine clear or pale yellow.  Eat high-fiber foods such as whole grain cereals and breads, brown rice, beans, and fresh fruits and vegetables every day. These foods may help prevent or relieve constipation.  Resume activities such as climbing stairs, driving, lifting, exercising, or traveling as directed by your caregiver.  Talk to your caregiver about resuming sexual activities. This is dependent upon your risk of infection, your rate of healing, and your comfort and desire to resume sexual activity.  Try to have someone help you with your household activities and your newborn for at least a few days after you leave the hospital.  Rest as much as possible. Try to rest or take a nap when your newborn is sleeping.  Increase your activities gradually.  Keep all of your scheduled postpartum appointments. It is very important to keep your scheduled follow-up appointments. At these appointments, your caregiver will be checking to make sure that you are healing physically and emotionally. SEEK MEDICAL CARE IF:   You are passing large clots from your vagina. Save any clots to show your caregiver.  You have a foul smelling discharge from your vagina.  You have trouble urinating.  You are  urinating frequently.  You have pain when you urinate.  You have a change in your bowel movements.  You have increasing redness, pain, or swelling near your incision.  You have pus draining from your incision.  Your incision is separating.  You have painful, hard, or reddened breasts.  You have a severe headache.  You have blurred vision or see spots.  You feel sad or depressed.  You have thoughts of hurting yourself or your newborn.  You have questions about your care, the care of your newborn, or medicines.  You are dizzy or lightheaded.  You have a rash.  You have pain, redness, or swelling at the site of the removed intravenous access (IV) tube.  You have nausea or vomiting.  You stopped breastfeeding and have not had a menstrual period within 12 weeks of stopping.  You are not breastfeeding and have not had a menstrual period within 12 weeks of delivery.  You have a fever. SEEK IMMEDIATE MEDICAL CARE IF:  You have persistent pain.  You have chest pain.  You have shortness of breath.  You faint.  You have leg pain.  You have stomach pain.  Your vaginal bleeding saturates 2 or more sanitary pads in 1 hour. MAKE SURE YOU:   Understand these instructions.  Will watch your condition.  Will get help right away if you are not doing well or get worse. Document Released: 10/09/2001 Document Revised: 10/12/2011 Document Reviewed: 09/14/2011 [facility] Patient Information 2014 [place].   Postpartum Depression and Baby Blues  The postpartum period begins right after the birth of a baby. During this time, there is often a great amount of joy and excitement. It is also a time of considerable changes in the life of the parent(s). Regardless of how many times a mother gives birth, each child brings new challenges and dynamics to the family. It is not unusual to have feelings of excitement accompanied by confusing shifts in moods, emotions, and thoughts. All  mothers are at risk of developing postpartum depression or the "baby blues." These mood changes can occur right after giving birth, or they may occur many months after giving birth. The baby blues or postpartum depression can be mild or severe. Additionally, postpartum depression can resolve rather quickly, or it can be a long-term condition. CAUSES Elevated hormones and their rapid decline are thought to be a main cause of postpartum depression and the baby blues. There are a number of hormones that radically change during and after pregnancy. Estrogen and progesterone usually decrease immediately after delivering your baby. The level of thyroid hormone and various cortisol steroids also rapidly drop. Other factors that play a major role in these changes include major life events and genetics.  RISK FACTORS If you have any of the following risks for the baby blues or postpartum depression, know what symptoms to watch out for during the postpartum period. Risk factors that may increase the likelihood of getting the baby blues or postpartum depression include: 1. Havinga personal or family history of depression. 2. Having depression while being pregnant. 3. Having premenstrual or oral contraceptive-associated mood issues. 4. Having exceptional life stress. 5. Having marital conflict. 6. Lacking a social support network. 7. Having a baby with special needs. 8. Having health problems such as diabetes. SYMPTOMS Baby blues symptoms include:  Brief fluctuations in mood, such as going from extreme happiness to sadness.  Decreased concentration.  Difficulty sleeping.  Crying spells, tearfulness.  Irritability.  Anxiety. Postpartum depression symptoms typically begin within the first month after giving birth. These symptoms include:  Difficulty sleeping or excessive sleepiness.  Marked weight loss.  Agitation.  Feelings of worthlessness.  Lack of interest in activity or food. Postpartum  psychosis is a very concerning condition and can be dangerous. Fortunately, it is rare. Displaying any of the following symptoms is cause for immediate medical attention. Postpartum psychosis symptoms include:  Hallucinations and delusions.  Bizarre or disorganized behavior.  Confusion or disorientation. DIAGNOSIS  A diagnosis is made by an evaluation of your symptoms. There are no medical or lab tests that lead to a diagnosis, but there are various questionnaires that a caregiver may use to identify those with the baby blues, postpartum depression, or psychosis. Often times, a screening tool called the [place] Postnatal Depression Scale is used to diagnose depression in the postpartum period.  TREATMENT The baby blues usually goes away on its own in 1 to 2 weeks. Social  support is often all that is needed. You should be encouraged to get adequate sleep and rest. Occasionally, you may be given medicines to help you sleep.  Postpartum depression requires treatment as it can last several months or longer if it is not treated. Treatment may include individual or group therapy, medicine, or both to address any social, physiological, and psychological factors that may play a role in the depression. Regular exercise, a healthy diet, rest, and social support may also be strongly recommended.  Postpartum psychosis is more serious and needs treatment right away. Hospitalization is often needed. HOME CARE INSTRUCTIONS  Get as much rest as you can. Nap when the baby sleeps.  Exercise regularly. Some women find yoga and walking to be beneficial.  Eat a balanced and nourishing diet.  Do little things that you enjoy. Have a cup of tea, take a bubble bath, read your favorite magazine, or listen to your favorite music.  Avoid alcohol.  Ask for help with household chores, cooking, grocery shopping, or running errands as needed. Do not try to do everything.  Talk to people close to you about how you are  feeling. Get support from your partner, family members, friends, or other new moms.  Try to stay positive in how you think. Think about the things you are grateful for.  Do not spend a lot of time alone.  Only take medicine as directed by your caregiver.  Keep all your postpartum appointments.  Let your caregiver know if you have any concerns. SEEK MEDICAL CARE IF: You are having a reaction or problems with your medicine. SEEK IMMEDIATE MEDICAL CARE IF:  You have suicidal feelings.  You feel you may harm the baby or someone else. Document Released: 10/22/2003 Document Revised: 04/11/2011 Document Reviewed: 11/23/2010 [facility] Patient Information 2014 [place], [place].   Breastfeeding Deciding to breastfeed is one of the best choices you can make for you and your baby. A change in hormones during pregnancy causes your breast tissue to grow and increases the number and size of your milk ducts. These hormones also allow proteins, sugars, and fats from your blood supply to make breast milk in your milk-producing glands. Hormones prevent breast milk from being released before your baby is born as well as prompt milk flow after birth. Once breastfeeding has begun, thoughts of your baby, as well as his or her sucking or crying, can stimulate the release of milk from your milk-producing glands.  BENEFITS OF BREASTFEEDING For Your Baby  Your first milk (colostrum) helps your baby's digestive system function better.   There are antibodies in your milk that help your baby fight off infections.   Your baby has a lower incidence of asthma, allergies, and sudden infant death syndrome.   The nutrients in breast milk are better for your baby than infant formulas and are designed uniquely for your baby's needs.   Breast milk improves your baby's brain development.   Your baby is less likely to develop other conditions, such as childhood obesity, asthma, or type 2 diabetes mellitus.  For You    Breastfeeding helps to create a very special bond between you and your baby.   Breastfeeding is convenient. Breast milk is always available at the correct temperature and costs nothing.   Breastfeeding helps to burn calories and helps you lose the weight gained during pregnancy.   Breastfeeding makes your uterus contract to its prepregnancy size faster and slows bleeding (lochia) after you give birth.   Breastfeeding helps to  lower your risk of developing type 2 diabetes mellitus, osteoporosis, and breast or ovarian cancer later in life. SIGNS THAT YOUR BABY IS HUNGRY Early Signs of Hunger  Increased alertness or activity.  Stretching.  Movement of the head from side to side.  Movement of the head and opening of the mouth when the corner of the mouth or cheek is stroked (rooting).  Increased sucking sounds, smacking lips, cooing, sighing, or squeaking.  Hand-to-mouth movements.  Increased sucking of fingers or hands. Late Signs of Hunger  Fussing.  Intermittent crying. Extreme Signs of Hunger Signs of extreme hunger will require calming and consoling before your baby will be able to breastfeed successfully. Do not wait for the following signs of extreme hunger to occur before you initiate breastfeeding:   Restlessness.  A loud, strong cry.   Screaming.  BREASTFEEDING BASICS Breastfeeding Initiation  Find a comfortable place to sit or lie down, with your neck and back well supported.  Place a pillow or rolled up blanket under your baby to bring him or her to the level of your breast (if you are seated). Nursing pillows are specially designed to help support your arms and your baby while you breastfeed.  Make sure that your baby's abdomen is facing your abdomen.   Gently massage your breast. With your fingertips, massage from your chest wall toward your nipple in a circular motion. This encourages milk flow. You may need to continue this action during the  feeding if your milk flows slowly.  Support your breast with 4 fingers underneath and your thumb above your nipple. Make sure your fingers are well away from your nipple and your baby's mouth.   Stroke your baby's lips gently with your finger or nipple.   When your baby's mouth is open wide enough, quickly bring your baby to your breast, placing your entire nipple and as much of the colored area around your nipple (areola) as possible into your baby's mouth.   More areola should be visible above your baby's upper lip than below the lower lip.   Your baby's tongue should be between his or her lower gum and your breast.   Ensure that your baby's mouth is correctly positioned around your nipple (latched). Your baby's lips should create a seal on your breast and be turned out (everted).  It is common for your baby to suck about 2-3 minutes in order to start the flow of breast milk. Latching Teaching your baby how to latch on to your breast properly is very important. An improper latch can cause nipple pain and decreased milk supply for you and poor weight gain in your baby. Also, if your baby is not latched onto your nipple properly, he or she may swallow some air during feeding. This can make your baby fussy. Burping your baby when you switch breasts during the feeding can help to get rid of the air. However, teaching your baby to latch on properly is still the best way to prevent fussiness from swallowing air while breastfeeding. Signs that your baby has successfully latched on to your nipple:    Silent tugging or silent sucking, without causing you pain.   Swallowing heard between every 3-4 sucks.    Muscle movement above and in front of his or her ears while sucking.  Signs that your baby has not successfully latched on to nipple:   Sucking sounds or smacking sounds from your baby while breastfeeding.  Nipple pain. If you think your baby has  not latched on correctly, slip your  finger into the corner of your baby's mouth to break the suction and place it between your baby's gums. Attempt breastfeeding initiation again. Signs of Successful Breastfeeding Signs from your baby:   A gradual decrease in the number of sucks or complete cessation of sucking.   Falling asleep.   Relaxation of his or her body.   Retention of a small amount of milk in his or her mouth.   Letting go of your breast by himself or herself. Signs from you:  Breasts that have increased in firmness, weight, and size 1-3 hours after feeding.   Breasts that are softer immediately after breastfeeding.  Increased milk volume, as well as a change in milk consistency and color by the fifth day of breastfeeding.   Nipples that are not sore, cracked, or bleeding. Signs That Your [employer] is Getting Enough Milk  Wetting at least 3 diapers in a 24-hour period. The urine should be clear and pale yellow by age 71 days.  At least 3 stools in a 24-hour period by age 71 days. The stool should be soft and yellow.  At least 3 stools in a 24-hour period by age 71 days. The stool should be seedy and yellow.  No loss of weight greater than 10% of birth weight during the first 46 days of age.  Average weight gain of 4-7 ounces (113-198 g) per week after age 67 days.  Consistent daily weight gain by age 671 days, without weight loss after the age of 2 weeks. After a feeding, your baby may spit up a small amount. This is common. BREASTFEEDING FREQUENCY AND DURATION Frequent feeding will help you make more milk and can prevent sore nipples and breast engorgement. Breastfeed when you feel the need to reduce the fullness of your breasts or when your baby shows signs of hunger. This is called "breastfeeding on demand." Avoid introducing a pacifier to your baby while you are working to establish breastfeeding (the first 4-6 weeks after your baby is born). After this time you may choose to use a pacifier. Research has  shown that pacifier use during the first year of a baby's life decreases the risk of sudden infant death syndrome (SIDS). Allow your baby to feed on each breast as long as he or she wants. Breastfeed until your baby is finished feeding. When your baby unlatches or falls asleep while feeding from the first breast, offer the second breast. Because newborns are often sleepy in the first few weeks of life, you may need to awaken your baby to get him or her to feed. Breastfeeding times will vary from baby to baby. However, the following rules can serve as a guide to help you ensure that your baby is properly fed:  Newborns (babies 87 weeks of age or younger) may breastfeed every 1-3 hours.  Newborns should not go longer than 3 hours during the day or 5 hours during the night without breastfeeding.  You should breastfeed your baby a minimum of 8 times in a 24-hour period until you begin to introduce solid foods to your baby at around 31 months of age. BREAST MILK PUMPING Pumping and storing breast milk allows you to ensure that your baby is exclusively fed your breast milk, even at times when you are unable to breastfeed. This is especially important if you are going back to work while you are still breastfeeding or when you are not able to be present during feedings. Your  lactation consultant can give you guidelines on how long it is safe to store breast milk.  A breast pump is a machine that allows you to pump milk from your breast into a sterile bottle. The pumped breast milk can then be stored in a refrigerator or freezer. Some breast pumps are operated by hand, while others use electricity. Ask your lactation consultant which type will work best for you. Breast pumps can be purchased, but some hospitals and breastfeeding support groups lease breast pumps on a monthly basis. A lactation consultant can teach you how to hand express breast milk, if you prefer not to use a pump.  CARING FOR YOUR BREASTS WHILE  YOU BREASTFEED Nipples can become dry, cracked, and sore while breastfeeding. The following recommendations can help keep your breasts moisturized and healthy:  Avoid using soap on your nipples.   Wear a supportive bra. Although not required, special nursing bras and tank tops are designed to allow access to your breasts for breastfeeding without taking off your entire bra or top. Avoid wearing underwire-style bras or extremely tight bras.  Air dry your nipples for 3-60minutes after each feeding.   Use only cotton bra pads to absorb leaked breast milk. Leaking of breast milk between feedings is normal.   Use lanolin on your nipples after breastfeeding. Lanolin helps to maintain your skin's normal moisture barrier. If you use pure lanolin, you do not need to wash it off before feeding your baby again. Pure lanolin is not toxic to your baby. You may also hand express a few drops of breast milk and gently massage that milk into your nipples and allow the milk to air dry. In the first few weeks after giving birth, some women experience extremely full breasts (engorgement). Engorgement can make your breasts feel heavy, warm, and tender to the touch. Engorgement peaks within 3-5 days after you give birth. The following recommendations can help ease engorgement:  Completely empty your breasts while breastfeeding or pumping. You may want to start by applying warm, moist heat (in the shower or with warm water-soaked hand towels) just before feeding or pumping. This increases circulation and helps the milk flow. If your baby does not completely empty your breasts while breastfeeding, pump any extra milk after he or she is finished.  Wear a snug bra (nursing or regular) or tank top for 1-2 days to signal your body to slightly decrease milk production.  Apply ice packs to your breasts, unless this is too uncomfortable for you.  Make sure that your baby is latched on and positioned properly while  breastfeeding. If engorgement persists after 48 hours of following these recommendations, contact your health care provider or a Science writer. OVERALL HEALTH CARE RECOMMENDATIONS WHILE BREASTFEEDING  Eat healthy foods. Alternate between meals and snacks, eating 3 of each per day. Because what you eat affects your breast milk, some of the foods may make your baby more irritable than usual. Avoid eating these foods if you are sure that they are negatively affecting your baby.  Drink milk, fruit juice, and water to satisfy your thirst (about 10 glasses a day).   Rest often, relax, and continue to take your prenatal vitamins to prevent fatigue, stress, and anemia.  Continue breast self-awareness checks.  Avoid chewing and smoking tobacco.  Avoid alcohol and drug use. Some medicines that may be harmful to your baby can pass through breast milk. It is important to ask your health care provider before taking any medicine, including all  over-the-counter and prescription medicine as well as vitamin and herbal supplements. It is possible to become pregnant while breastfeeding. If birth control is desired, ask your health care provider about options that will be safe for your baby. SEEK MEDICAL CARE IF:   You feel like you want to stop breastfeeding or have become frustrated with breastfeeding.  You have painful breasts or nipples.  Your nipples are cracked or bleeding.  Your breasts are red, tender, or warm.  You have a swollen area on either breast.  You have a fever or chills.  You have nausea or vomiting.  You have drainage other than breast milk from your nipples.  Your breasts do not become full before feedings by the fifth day after you give birth.  You feel sad and depressed.  Your baby is too sleepy to eat well.  Your baby is having trouble sleeping.   Your baby is wetting less than 3 diapers in a 24-hour period.  Your baby has less than 3 stools in a 24-hour  period.  Your baby's skin or the white part of his or her eyes becomes yellow.   Your baby is not gaining weight by 82 days of age. SEEK IMMEDIATE MEDICAL CARE IF:   Your baby is overly tired (lethargic) and does not want to wake up and feed.  Your baby develops an unexplained fever. Document Released: 01/17/2005 Document Revised: 01/22/2013 Document Reviewed: 07/11/2012 [facility] Patient Information 2015 [place], [place]. This information is not intended to replace advice given to you by your health care provider. Make sure you discuss any questions you have with your health care provider.

## 2013-12-25 NOTE — Progress Notes (Signed)
Patient received discharge instructions and prescriptions from RN on previous shift, Karmen Bongo. Advice was given to patient on where to fill out her prescriptions tonight. Wheelchair brought to push patient out but she insisted on ambulating on her own and going down to the NICU. Patient expressed gratitude for her care here at [facility] and had no further questions. Her belongings were taken by her spouse to the vehicle.

## 2013-12-25 NOTE — Progress Notes (Signed)
PROGRESS NOTE  I have reviewed the patient's vital signs, labs, and notes. I have examined the patient. I agree with the previous note from the Certified Nurse Midwife.  Gildardo Cranker, M.D. 12/25/2013

## 2013-12-29 ENCOUNTER — Ambulatory Visit: Payer: Self-pay

## 2013-12-29 NOTE — Lactation Note (Signed)
This note was copied from the chart of [name] [place]. Lactation Consultation Note    Follow up consuls with this mom of a NICU baby, now 52 days old, and 32 3/7 weeks CGA. Mom still has cracks at the base of her nipples.  She has been pumping one breast at a time, to add massage. I advised mom to purchase or make a hands free bra, or have dad help with massage, She is using a 30 flange. I asked that she make sure her areola is not getting pulled with the larger flange, to set her suction at a comfortable level, and to pump both breast at the same time. i suggested she bring in her pump parts tomorrow, and have the lactation consultant on observe her pumping, to make sure her flanges are the correct fit. Mom is expressing 60 mls every 3 hours, a good amount.   Patient Name: [name] [name] [id number] Date: 12/29/2013 Reason for consult: Follow-up assessment;NICU baby   Maternal Data    Feeding Feeding Type: Breast Milk Length of feed: 30 min  LATCH Score/Interventions                      Lactation Tools Discussed/Used     Consult Status Consult Status: Follow-up Date: 12/30/13 Follow-up type: In-patient (NICU)    [name] 12/29/2013, 2:35 PM

## 2014-01-01 ENCOUNTER — Ambulatory Visit (HOSPITAL_COMMUNITY): Payer: Medicaid Other

## 2014-01-08 ENCOUNTER — Ambulatory Visit: Payer: Self-pay

## 2014-01-08 NOTE — Lactation Note (Signed)
This note was copied from the chart of [name] [place]. Lactation Consultation Note Follow up with mom in the NICU.  Mom is pumping every 3 hours and has a good milk supply but continues to feel pain with pumping.   I observed mom pump today using 24 mm flanges.  Both nipples cracked on tips.  Nipples are rubbing with 24 mm flanges and mom is using the maximum suction.  Recommended she turn her suction down to medium.  Mom given a 27 mm and 30 mm flange and she notices much less pain.  I instructed mom to take both home to try but today it looks like the 30 mm is the best fit.  I told mom to discontinue petroleum jelly and to call her OB MD for All Purpose Nipple Ointment.  Recommended olive oil or coconut oil prior to pumping. Comfort gels also given with instructions.  Encouraged her to call if no improvement in a few days.  Patient Name: [name] [name] [id number] Date: 01/08/2014     Maternal Data    Feeding Feeding Type: Breast Milk Length of feed: 60 min  LATCH Score/Interventions                      Lactation Tools Discussed/Used     Consult Status      Ave Filter 01/08/2014, 3:37 PM

## 2014-01-27 ENCOUNTER — Ambulatory Visit: Payer: Self-pay

## 2014-01-27 NOTE — Lactation Note (Signed)
This note was copied from the chart of [name] [place]. Lactation Consultation Note  Patient Name: [name] [name] [id number] Date: 01/27/2014 Reason for consult: Follow-up assessment;Difficult latch;NICU baby.  Mom is working full-time, 0900-1700 Mon-Fri and has had difficulty meeting with [place] for attempting to latch her NICU baby, born [birth year] and now progressing to tube and nipple feedings.  Mom states that she is pumping q3h and obtaining 50-60 ml's per pumping but despite her nipples appearing intact with no inflammation, she states they are tender/"sore" from pumping.  She pumped a few hours ago at work and breasts are full but not engorged and are soft enough to compress during latching of her baby.  She has boppy on her lap and baby is already receiving gavage feeding but is able to open mouth wide enough to grasp areola with lips flanged and some intermittent sucks and a few quiet swallows noted.  Baby is latched on (L) breast in cross-cradle but after 10 minutes, LC encouraged mom to move her arm around baby in traditional cradle for remainder of feeding.  LC explained that feeding a premature baby is achieved in small steps and that this is first time her baby [name], [name] [name], has latched.  [place] encouraged mom to continue pumping regularly and RN, Collie Siad will schedule an appt for Thurs, Dec 31 w/LC and [facility] will inform the NICU LC of this assessment and need for follow-up.  Mom is saving time off for after baby is discharged home which is presenting a barrier to her being able to visit baby frequently at this time.   Maternal Data    Feeding    LATCH Score/Interventions Latch: Repeated attempts needed to sustain latch, nipple held in mouth throughout feeding, stimulation needed to elicit sucking reflex. Intervention(s): Assist with latch;Breast compression  Audible Swallowing: A few with stimulation Intervention(s): Alternate breast massage  Type of Nipple: Everted at rest and after  stimulation  Comfort (Breast/Nipple): Filling, red/small blisters or bruises, mild/mod discomfort (no visible trauma but mom says they are sore from pumping)  Problem noted: Mild/Moderate discomfort Interventions (Mild/moderate discomfort): Hand expression (mom to try coconut oil or olive oil sparingly to lubricate nipples and pump flange when pumping)  Hold (Positioning): Assistance needed to correctly position infant at breast and maintain latch. (mom able to hold baby on boppy with rolled blanket suppoert, first cross-cradle then cradle) Intervention(s): Support Pillows;Position options (recommended cross-cradle for easier latch and more control)  LATCH Score: 6 (LC assisted and observed)  Lactation Tools Discussed/Used   Positioning, signs of proper latch and ways to encourage strong sucking bursts (breast compression and/or gentle stimulation of baby's jaw at intervals during feeding) DEBP  Consult Status Consult Status: Follow-up Follow-up type: Other (comment) (RN to schedule w/LC on 01/20/14)    Bernita Buffy 01/27/2014, 6:00 PM

## 2014-01-30 ENCOUNTER — Ambulatory Visit: Payer: Self-pay

## 2014-01-30 NOTE — Lactation Note (Signed)
This note was copied from the chart of [name] [place]. Lactation Consultation Note     Follow up consult with this mom and baby, now 53 weeks old, 37 weeks CA, and weighs 4 lbs 12 oz. I assisted mom with latching baby in cross cradle hold. The baby was latched, mostly with my help. But did not suckle. She has bottle fed 11 mls prior to this, so was not showing hunger cues. Mom did skin to skin while the baby was ng fed. I will work with mom and baby more, both in NICU and o/p as needed,  Patient Name: [name] [name] [id number] Date: 01/30/2014 Reason for consult: Follow-up assessment   Maternal Data    Feeding Feeding Type: Breast Fed Length of feed: 30 min  LATCH Score/Interventions Latch: Repeated attempts needed to sustain latch, nipple held in mouth throughout feeding, stimulation needed to elicit sucking reflex. (able to latch baby, nipple fills baby's mouth, no suckles) Intervention(s): Assist with latch  Audible Swallowing: None Intervention(s): Skin to skin  Type of Nipple: Everted at rest and after stimulation (very large nipple, just fits in baby's mouth)  Comfort (Breast/Nipple): Soft / non-tender     Hold (Positioning): Assistance needed to correctly position infant at breast and maintain latch. Intervention(s): Breastfeeding basics reviewed;Support Pillows;Position options;Skin to skin  LATCH Score: 6  Lactation Tools Discussed/Used     Consult Status Consult Status: Follow-up Date: 01/31/14 Follow-up type: In-patient    [name] 01/30/2014, 2:31 PM

## 2014-02-04 ENCOUNTER — Ambulatory Visit: Payer: Self-pay

## 2014-02-04 NOTE — Lactation Note (Signed)
This note was copied from the chart of [name] [name]. Lactation Consultation Note        Follow up consult with this mom and NICU baby, now 83 weeks old, and 37 5/7 weeks CGA, weighing 5 pounds. Mom was able to latch baby easiioly today, deeply, with strong suckles for 30 minutes. Milk was dripping from mom's breast after feeding. Baby was fed supplement with EBM by bottle after breast feeding. I told mom we will do a pre and post weight sometime this week, to see how much she is actually transferring at the breast. I explained that even though she is close to full term now, she is still small, and may not be able to transfer enough to grow. Mom is worried that she will not know how much she takes with breast feeding. I told her she will offer supplement to the baby after breast feeidig,  And she will be followed by lactation  In o/p consults, as needed.  Patient Name: [name] Charlsey Moragne [id number] Date: 02/04/2014 Reason for consult: Follow-up assessment   Maternal Data    Feeding Feeding Type: Breast Fed Length of feed: 30 min  LATCH Score/Interventions Latch: Grasps breast easily, tongue down, lips flanged, rhythmical sucking.  Audible Swallowing: A few with stimulation  Type of Nipple: Everted at rest and after stimulation  Comfort (Breast/Nipple): Soft / non-tender     Hold (Positioning): Assistance needed to correctly position infant at breast and maintain latch.  LATCH Score: 8  Lactation Tools Discussed/Used     Consult Status Consult Status: PRN Follow-up type: In-patient (NICU)    [name] 02/04/2014, 6:02 PM

## 2014-10-08 IMAGING — CR DG CHEST 2V
2 series · 2 of 2 positions shown · non-contrast
Comparison: None

CLINICAL DATA: Annual physical.  Wheezing

CHEST - 2 VIEW

[view not recorded (1 of 2)]
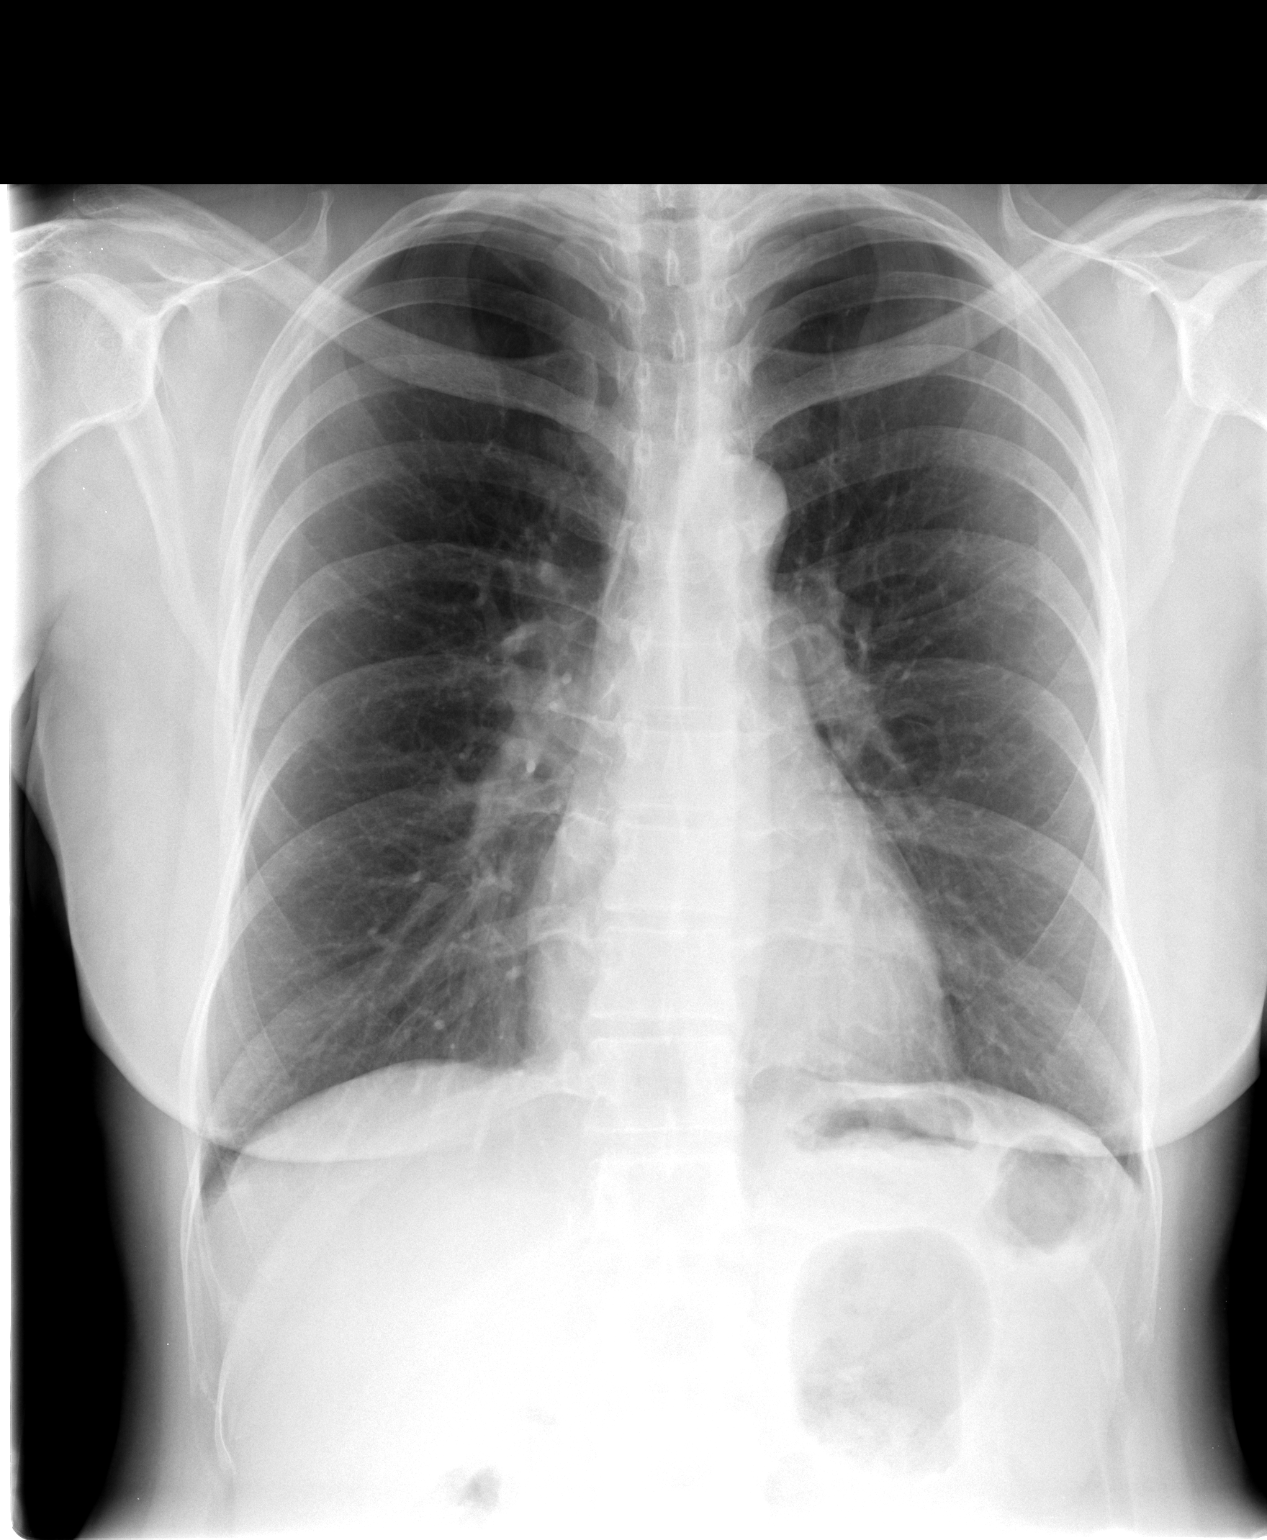

[view not recorded (2 of 2)]
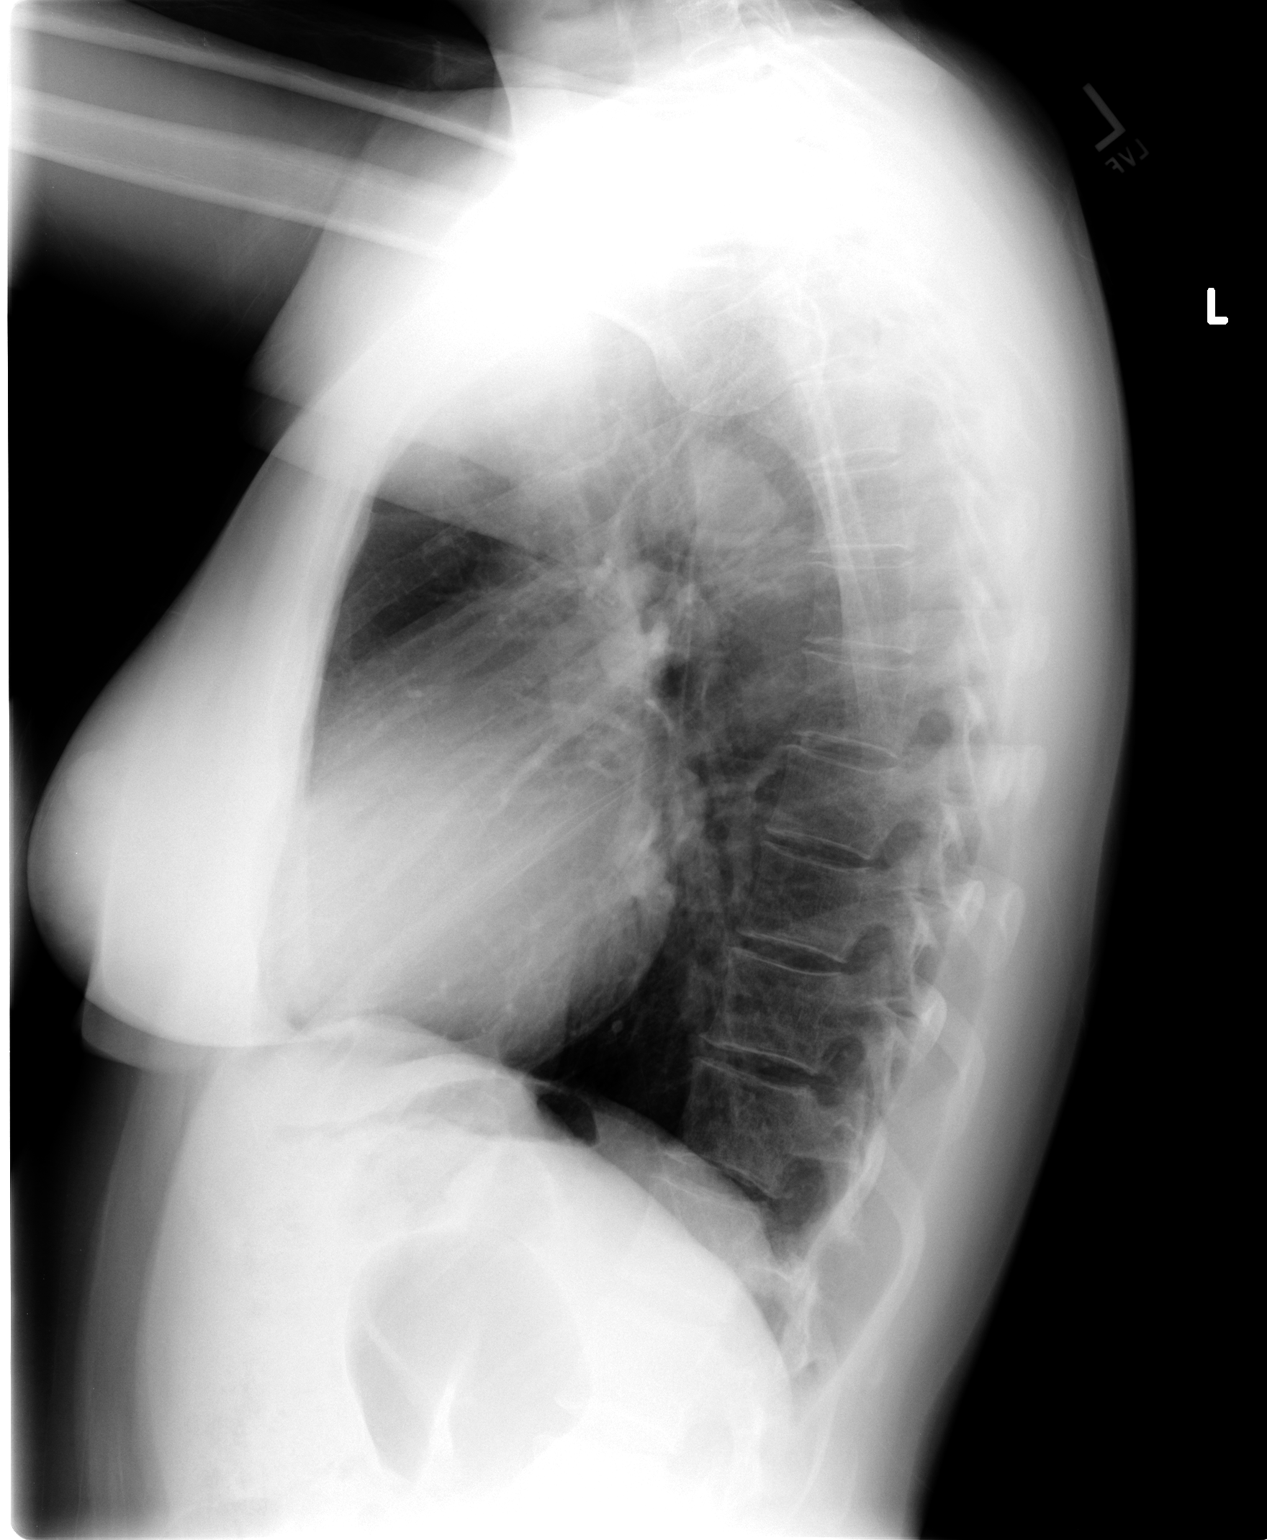

[2 of 2 positions shown; findings below may reference images not displayed]

FINDINGS: Heart size is upper normal.  Negative for heart failure.
Lungs are clear without infiltrate or effusion.  No mass or
adenopathy is present. Lung volume is normal.
IMPRESSION: No acute abnormality.

## 2015-03-19 ENCOUNTER — Other Ambulatory Visit: Payer: Self-pay | Admitting: Family Medicine

## 2015-03-19 DIAGNOSIS — R1013 Epigastric pain: Secondary | ICD-10-CM

## 2015-04-03 ENCOUNTER — Other Ambulatory Visit: Payer: Self-pay | Admitting: Obstetrics & Gynecology

## 2015-04-03 ENCOUNTER — Ambulatory Visit
Admission: RE | Admit: 2015-04-03 | Discharge: 2015-04-03 | Disposition: A | Discharge status: Home / Self Care | Payer: BLUE CROSS/BLUE SHIELD | Source: Ambulatory Visit | Attending: Family Medicine | Admitting: Family Medicine

## 2015-04-03 DIAGNOSIS — D259 Leiomyoma of uterus, unspecified: Secondary | ICD-10-CM

## 2015-04-03 DIAGNOSIS — R1013 Epigastric pain: Secondary | ICD-10-CM

## 2015-04-06 ENCOUNTER — Ambulatory Visit
Admission: RE | Admit: 2015-04-06 | Discharge: 2015-04-06 | Disposition: A | Discharge status: Home / Self Care | Payer: BLUE CROSS/BLUE SHIELD | Source: Ambulatory Visit | Attending: Obstetrics & Gynecology | Admitting: Obstetrics & Gynecology

## 2015-04-06 DIAGNOSIS — D259 Leiomyoma of uterus, unspecified: Secondary | ICD-10-CM

## 2015-06-08 DIAGNOSIS — R1084 Generalized abdominal pain: Secondary | ICD-10-CM | POA: Diagnosis not present

## 2015-07-03 ENCOUNTER — Other Ambulatory Visit: Payer: Self-pay | Admitting: Gastroenterology

## 2015-07-03 DIAGNOSIS — R1030 Lower abdominal pain, unspecified: Secondary | ICD-10-CM | POA: Diagnosis not present

## 2015-07-03 DIAGNOSIS — R140 Abdominal distension (gaseous): Secondary | ICD-10-CM | POA: Diagnosis not present

## 2015-07-03 DIAGNOSIS — R1011 Right upper quadrant pain: Secondary | ICD-10-CM | POA: Diagnosis not present

## 2015-07-13 ENCOUNTER — Other Ambulatory Visit: Payer: BLUE CROSS/BLUE SHIELD

## 2015-08-17 DIAGNOSIS — N979 Female infertility, unspecified: Secondary | ICD-10-CM | POA: Diagnosis not present

## 2015-09-04 DIAGNOSIS — N979 Female infertility, unspecified: Secondary | ICD-10-CM | POA: Diagnosis not present

## 2015-11-06 DIAGNOSIS — B373 Candidiasis of vulva and vagina: Secondary | ICD-10-CM | POA: Diagnosis not present

## 2015-11-06 DIAGNOSIS — R946 Abnormal results of thyroid function studies: Secondary | ICD-10-CM | POA: Diagnosis not present

## 2015-11-06 DIAGNOSIS — R1011 Right upper quadrant pain: Secondary | ICD-10-CM | POA: Diagnosis not present

## 2015-11-06 DIAGNOSIS — F329 Major depressive disorder, single episode, unspecified: Secondary | ICD-10-CM | POA: Diagnosis not present

## 2015-12-07 DIAGNOSIS — N390 Urinary tract infection, site not specified: Secondary | ICD-10-CM | POA: Diagnosis not present

## 2015-12-07 DIAGNOSIS — N898 Other specified noninflammatory disorders of vagina: Secondary | ICD-10-CM | POA: Diagnosis not present

## 2015-12-07 DIAGNOSIS — D649 Anemia, unspecified: Secondary | ICD-10-CM | POA: Diagnosis not present

## 2015-12-07 DIAGNOSIS — R109 Unspecified abdominal pain: Secondary | ICD-10-CM | POA: Diagnosis not present

## 2016-01-15 ENCOUNTER — Emergency Department (HOSPITAL_COMMUNITY)
Admission: EM | Admit: 2016-01-15 | Discharge: 2016-01-15 | Disposition: A | Discharge status: Home / Self Care | Payer: BLUE CROSS/BLUE SHIELD | Attending: Emergency Medicine | Admitting: Emergency Medicine

## 2016-01-15 ENCOUNTER — Encounter (HOSPITAL_COMMUNITY): Payer: Self-pay | Admitting: Emergency Medicine

## 2016-01-15 DIAGNOSIS — R112 Nausea with vomiting, unspecified: Secondary | ICD-10-CM | POA: Diagnosis not present

## 2016-01-15 DIAGNOSIS — Z79899 Other long term (current) drug therapy: Secondary | ICD-10-CM | POA: Insufficient documentation

## 2016-01-15 DIAGNOSIS — R197 Diarrhea, unspecified: Secondary | ICD-10-CM | POA: Diagnosis not present

## 2016-01-15 HISTORY — DX: Epigastric pain: R10.13

## 2016-01-15 HISTORY — DX: Irritable bowel syndrome, unspecified: K58.9

## 2016-01-15 LAB — COMPREHENSIVE METABOLIC PANEL
ALK PHOS: 56 U/L (ref 38–126)
ALT: 11 U/L — ABNORMAL LOW (ref 14–54)
ANION GAP: 8 (ref 5–15)
AST: 18 U/L (ref 15–41)
Albumin: 4.2 g/dL (ref 3.5–5.0)
BUN: 7 mg/dL (ref 6–20)
CALCIUM: 9.4 mg/dL (ref 8.9–10.3)
CO2: 21 mmol/L — AB (ref 22–32)
Chloride: 104 mmol/L (ref 101–111)
Creatinine, Ser: 0.61 mg/dL (ref 0.44–1.00)
Glucose, Bld: 128 mg/dL — ABNORMAL HIGH (ref 65–99)
Potassium: 3.8 mmol/L (ref 3.5–5.1)
SODIUM: 133 mmol/L — AB (ref 135–145)
Total Bilirubin: 0.5 mg/dL (ref 0.3–1.2)
Total Protein: 7.3 g/dL (ref 6.5–8.1)

## 2016-01-15 LAB — CBC
HCT: 36.3 % (ref 36.0–46.0)
HEMOGLOBIN: 11.1 g/dL — AB (ref 12.0–15.0)
MCH: 21.7 pg — AB (ref 26.0–34.0)
MCHC: 30.6 g/dL (ref 30.0–36.0)
MCV: 71 fL — ABNORMAL LOW (ref 78.0–100.0)
Platelets: 239 10*3/uL (ref 150–400)
RBC: 5.11 MIL/uL (ref 3.87–5.11)
RDW: 17.2 % — ABNORMAL HIGH (ref 11.5–15.5)
WBC: 8.9 10*3/uL (ref 4.0–10.5)

## 2016-01-15 LAB — I-STAT BETA HCG BLOOD, ED (MC, WL, AP ONLY): I-stat hCG, quantitative: <5 m[IU]/mL (ref <5)

## 2016-01-15 LAB — URINALYSIS, ROUTINE W REFLEX MICROSCOPIC
BILIRUBIN URINE: NEGATIVE
GLUCOSE, UA: NEGATIVE mg/dL
KETONES UR: NEGATIVE mg/dL
NITRITE: NEGATIVE
PH: 6 (ref 5.0–8.0)
PROTEIN: 30 mg/dL — AB
Specific Gravity, Urine: 1.016 (ref 1.005–1.030)

## 2016-01-15 LAB — LIPASE, BLOOD: LIPASE: 18 U/L (ref 11–51)

## 2016-01-15 MED ORDER — KETOROLAC TROMETHAMINE 30 MG/ML IJ SOLN
30.0000 mg | Freq: Once | INTRAMUSCULAR | Status: AC
  Administered 2016-01-15: 30 mg via INTRAVENOUS
  Filled 2016-01-15: qty 1

## 2016-01-15 MED ORDER — ONDANSETRON 4 MG PO TBDP: 4.0000 mg | ORAL_TABLET | Freq: Three times a day (TID) | ORAL | 0 refills | Status: DC | PRN

## 2016-01-15 MED ORDER — ONDANSETRON HCL 4 MG/2ML IJ SOLN
4.0000 mg | Freq: Once | INTRAMUSCULAR | Status: AC
  Administered 2016-01-15: 4 mg via INTRAVENOUS
  Filled 2016-01-15: qty 2

## 2016-01-15 MED ORDER — LOPERAMIDE HCL 2 MG PO CAPS: 2.0000 mg | ORAL_CAPSULE | Freq: Four times a day (QID) | ORAL | 0 refills | Status: DC | PRN

## 2016-01-15 MED ORDER — SODIUM CHLORIDE 0.9 % IV BOLUS (SEPSIS)
1000.0000 mL | Freq: Once | INTRAVENOUS | Status: AC
  Administered 2016-01-15: 1000 mL via INTRAVENOUS

## 2016-01-15 MED ORDER — DICYCLOMINE HCL 10 MG/ML IM SOLN
20.0000 mg | Freq: Once | INTRAMUSCULAR | Status: AC
  Administered 2016-01-15: 20 mg via INTRAMUSCULAR
  Filled 2016-01-15: qty 2

## 2016-01-15 MED ORDER — DICYCLOMINE HCL 20 MG PO TABS
20.0000 mg | ORAL_TABLET | Freq: Three times a day (TID) | ORAL | 0 refills | Status: AC
Start: 2016-01-15 — End: ?

## 2016-01-15 NOTE — Discharge Instructions (Signed)
To find a primary care or specialty doctor please call [phone] or [phone] to access "[facility] Find a Doctor Service." ° °You may also go on the [facility] website at [email or web address] ° °There are also multiple [facility], [facility] and [facility] practices throughout the [place] that are frequently accepting new patients. You may find a clinic that is close to your home and contact them. ° °[facility] -  °[address] °[place] [address] °[phone] ° ° °[facility] -  °[address] °[place] [address] °[phone] ° ° °[facility] - °[address]  °[place] [address] °[phone] ° ° °

## 2016-01-15 NOTE — ED Provider Notes (Signed)
By signing my name below, I, Gwenlyn Fudge, attest that this documentation has been prepared under the direction and in the presence of [place], DO. Electronically Signed: Gwenlyn Fudge, ED Scribe. 01/15/16. 3:12 AM.  TIME SEEN: 3:01 AM  CHIEF COMPLAINT: Abdominal Pain   HPI:  [name] [name] is a 35 y.o. female who presents to the Emergency Department complaining of gradual onset and worsening constant, periumbilical abdominal pain for 2 days. She describes pain as slight cramping and soreness that occasionally radiates up to the upper quadrants. Pain is not in the same location as prior kidney infection. Pt reports associated nausea, vomiting, diarrhea.  Pt states constipation started initially and she felt "full". Since then she has had diarrhea. She experienced 1x episode of vomiting on the way to the ED and her pain improved. Pt has abdomen PSHx of c-section in 11/15. Pt denies fever, chills, dysuria, hematuria, vaginal bleeding, vaginal discharge, vaginal bleeding, increased urinary frequency, flank pain, back pain.  ROS: See HPI Constitutional: no fever  Eyes: no drainage  ENT: no runny nose   Cardiovascular:  no chest pain  Resp: no SOB  GI: Vomiting GU: no dysuria Integumentary: no rash  Allergy: no hives  Musculoskeletal: no leg swelling  Neurological: no slurred speech ROS otherwise negative  PAST MEDICAL HISTORY/PAST SURGICAL HISTORY:  Past Medical History:  Diagnosis Date  . Anemia   . Anxiety   . Depression   . Dyspepsia   . Elevated LFTs   . Endometriosis   . Fibroids    uterine  . IBS (irritable bowel syndrome)   . Kidney stone   . Restless leg syndrome     MEDICATIONS:  Prior to Admission medications   Medication Sig Start Date End Date Taking? Authorizing Provider  escitalopram (LEXAPRO) 10 MG tablet Take 10 mg by mouth daily.      Historical Provider, MD  ferrous sulfate 325 (65 FE) MG tablet Take 650 mg by mouth 2 (two) times daily with a meal.     Historical Provider, MD  ibuprofen (ADVIL,MOTRIN) 600 MG tablet Take 1 tablet (600 mg total) by mouth every 6 (six) hours. 12/25/13   Venus Standard, CNM  oxyCODONE-acetaminophen (PERCOCET/ROXICET) 5-325 MG per tablet Take 1 tablet by mouth every 4 (four) hours as needed (for pain scale less than 7). 12/25/13   Venus Standard, CNM  pantoprazole (PROTONIX) 40 MG tablet Take 40 mg by mouth daily.    Historical Provider, MD  Sertraline HCl (ZOLOFT PO) Take by mouth.    Historical Provider, MD    ALLERGIES:  Allergies  Allergen Reactions  . Penicillins Other (See Comments)    Childhood allergy;reaction unknown    SOCIAL HISTORY:  Social History  Substance Use Topics  . Smoking status: Never Smoker  . Smokeless tobacco: Never Used  . Alcohol use No    FAMILY HISTORY: Family History  Problem Relation Age of Onset  . Colon cancer Father   . Colon polyps Father   . Anesthesia problems Neg Hx   . Hypotension Neg Hx   . Pseudochol deficiency Neg Hx   . Malignant hyperthermia Neg Hx     EXAM: BP (!) 133/107 (BP Location: Left Arm)   Pulse 70   Temp 98.4 F (36.9 C) (Oral)   Resp 19   LMP 12/25/2015 (Approximate)   SpO2 99%  CONSTITUTIONAL: Alert and oriented and responds appropriately to questions. Well-appearing; well-nourished HEAD: Normocephalic EYES: Conjunctivae clear, PERRL, EOMI ENT: normal nose; no rhinorrhea; moist  mucous membranes NECK: Supple, no meningismus, no nuchal rigidity, no LAD  CARD: RRR; S1 and S2 appreciated; no murmurs, no clicks, no rubs, no gallops RESP: Normal chest excursion without splinting or tachypnea; breath sounds clear and equal bilaterally; no wheezes, no rhonchi, no rales, no hypoxia or respiratory distress, speaking full sentences ABD/GI: Normal bowel sounds; non-distended; soft, minimally tender in the umbilical region, no rebound, no guarding, no peritoneal signs, no hepatosplenomegaly BACK:  The back appears normal and is non-tender to  palpation, there is no CVA tenderness EXT: Normal ROM in all joints; non-tender to palpation; no edema; normal capillary refill; no cyanosis, no calf tenderness or swelling    SKIN: Normal color for age and race; warm; no rash NEURO: Moves all extremities equally, sensation to light touch intact diffusely, cranial nerves II through XII intact, normal speech PSYCH: The patient's mood and manner are appropriate. Grooming and personal hygiene are appropriate.  MEDICAL DECISION MAKING: Patient here with abdominal pain, nausea, vomiting and diarrhea. Abdominal exam benign. Suspect viral illness. Low suspicion for appendicitis, colitis, diverticulitis, cholecystitis, pancreatitis.  At this time I do not feel she needs emergent abdominal imaging. Labs obtained in triage are pending. Urine shows blood, leukocytes and rare bacteria and many squamous cells. She denies having any urinary symptoms including dysuria, hematuria, frequency or urgency, flank pain. No lower abdominal pain she has had with previous UTIs. Will send urine culture but I do not feel this needs to be treated if she is not symptomatic. Denies vaginal bleeding or discharge. Will treat with portal, Bentyl, Zofran, IV fluids and reassess. We'll fluid challenge patient.  ED PROGRESS: Patient's labs are unremarkable. No leukocytosis. LFTs, lipase, creatinine normal. She is not pregnant.   4:30 AM  Pt's labs are unremarkable. She reports feeling much better. Abdominal exam still benign. Drinking without difficulty. Hemodynamically stable, afebrile and nontoxic. Suspect viral gastroenteritis. Have discussed with her at length return precautions. Will give PCP follow-up information. We'll discharge with prescriptions for Bentyl, Zofran, Imodium. She is comfortable with this plan.   At this time, I do not feel there is any life-threatening condition present. I have reviewed and discussed all results (EKG, imaging, lab, urine as appropriate) and exam  findings with patient/family. I have reviewed nursing notes and appropriate previous records.  I feel the patient is safe to be discharged home without further emergent workup and can continue workup as an outpatient as needed. Discussed usual and customary return precautions. Patient/family verbalize understanding and are comfortable with this plan.  Outpatient follow-up has been provided. All questions have been answered.      I personally performed the services described in this documentation, which was scribed in my presence. The recorded information has been reviewed and is accurate.    [place], DO 01/15/16 [phone]

## 2016-01-15 NOTE — ED Triage Notes (Signed)
Pt. reports worsening mid abdominal pain with nausea , emesis , diarrhea and constipation onset this week , denies fever or chills , no urinary discomfort.

## 2016-01-16 LAB — URINE CULTURE

## 2016-01-17 ENCOUNTER — Telehealth: Payer: Self-pay

## 2016-01-17 NOTE — Telephone Encounter (Signed)
Post ED Visit - Positive Culture Follow-up  Culture report reviewed by antimicrobial stewardship pharmacist:  []  Elenor Quinones, Pharm.D. []  Heide Guile, Pharm.D., BCPS []  Parks Neptune, Pharm.D. []  Alycia Rossetti, Pharm.D., BCPS []  [place], Pharm.D., BCPS, AAHIVP []  Legrand Como, Pharm.D., BCPS, AAHIVP []  Milus Glazier, Pharm.D. []  Stephens November, Pharm.D. Ebony Hail Masters Pharm D Positive urine culture  and no further patient follow-up is required at this time.  Genia Del 01/17/2016, 2:03 PM

## 2016-01-18 DIAGNOSIS — R1013 Epigastric pain: Secondary | ICD-10-CM | POA: Diagnosis not present

## 2016-01-27 DIAGNOSIS — A09 Infectious gastroenteritis and colitis, unspecified: Secondary | ICD-10-CM | POA: Diagnosis not present

## 2016-02-10 DIAGNOSIS — R140 Abdominal distension (gaseous): Secondary | ICD-10-CM | POA: Diagnosis not present

## 2016-02-10 DIAGNOSIS — R1030 Lower abdominal pain, unspecified: Secondary | ICD-10-CM | POA: Diagnosis not present

## 2016-02-10 DIAGNOSIS — R1011 Right upper quadrant pain: Secondary | ICD-10-CM | POA: Diagnosis not present

## 2016-02-25 DIAGNOSIS — K2930 Chronic superficial gastritis without bleeding: Secondary | ICD-10-CM | POA: Diagnosis not present

## 2016-02-25 DIAGNOSIS — R1013 Epigastric pain: Secondary | ICD-10-CM | POA: Diagnosis not present

## 2016-02-25 DIAGNOSIS — D509 Iron deficiency anemia, unspecified: Secondary | ICD-10-CM | POA: Diagnosis not present

## 2016-02-29 DIAGNOSIS — M9901 Segmental and somatic dysfunction of cervical region: Secondary | ICD-10-CM | POA: Diagnosis not present

## 2016-02-29 DIAGNOSIS — M6240 Contracture of muscle, unspecified site: Secondary | ICD-10-CM | POA: Diagnosis not present

## 2016-02-29 DIAGNOSIS — M9902 Segmental and somatic dysfunction of thoracic region: Secondary | ICD-10-CM | POA: Diagnosis not present

## 2016-03-01 DIAGNOSIS — K2930 Chronic superficial gastritis without bleeding: Secondary | ICD-10-CM | POA: Diagnosis not present

## 2016-03-04 DIAGNOSIS — M9901 Segmental and somatic dysfunction of cervical region: Secondary | ICD-10-CM | POA: Diagnosis not present

## 2016-03-04 DIAGNOSIS — M6240 Contracture of muscle, unspecified site: Secondary | ICD-10-CM | POA: Diagnosis not present

## 2016-03-04 DIAGNOSIS — M9902 Segmental and somatic dysfunction of thoracic region: Secondary | ICD-10-CM | POA: Diagnosis not present

## 2016-03-22 IMAGING — US US OB FOLLOW-UP
1 series · 16 of 28 positions shown · non-contrast
Comparison: none

[Series 1: us ob follow-up · 0.23mm/px · 16 of 63 slices shown]
[im 1/63]
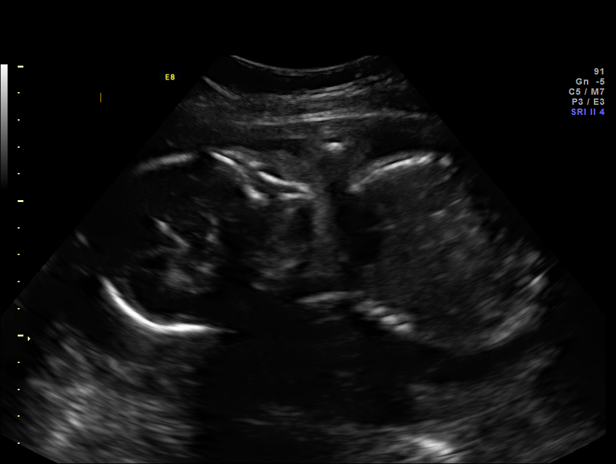
[im 5/63]
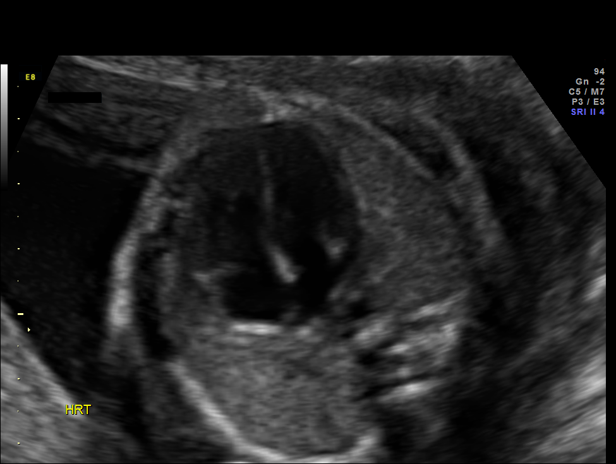
[im 10/63]
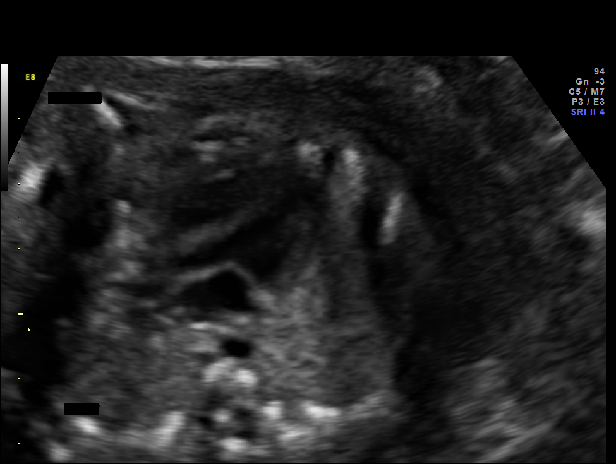
[im 14/63]
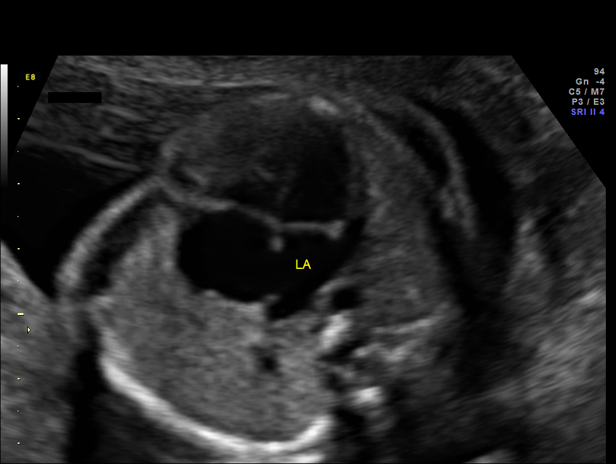
[im 17/63]
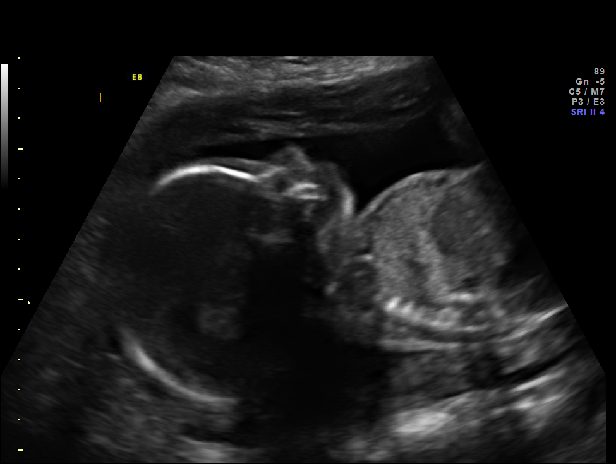
[im 21/63]
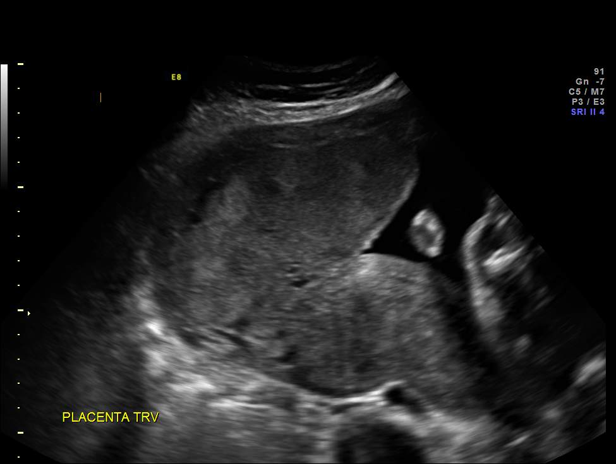
[im 26/63]
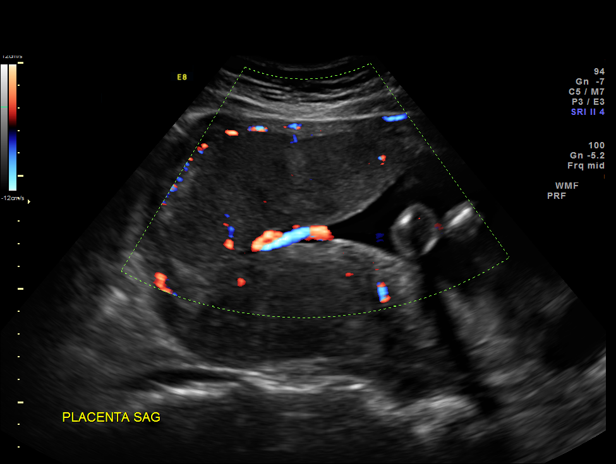
[im 30/63]
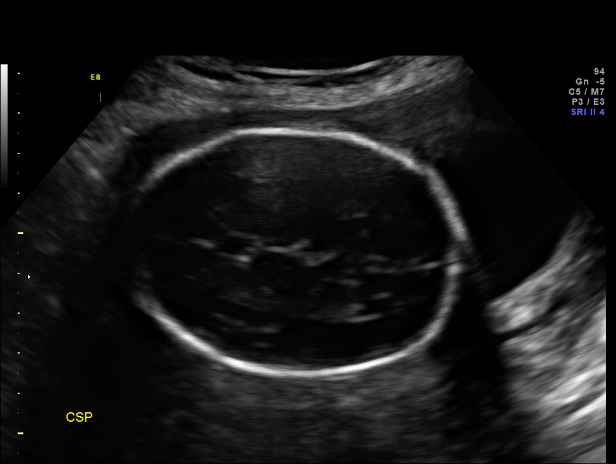
[im 33/63]
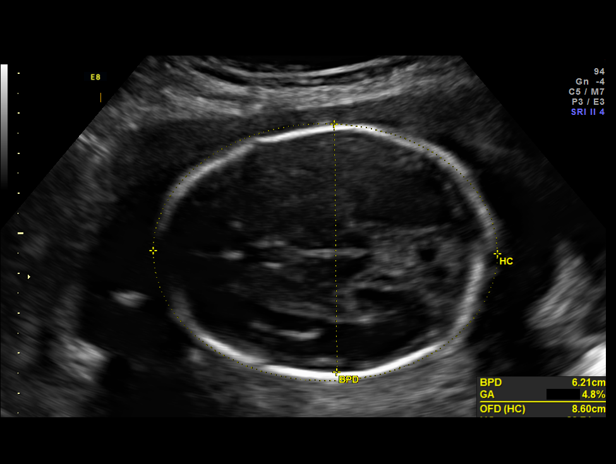
[im 37/63]
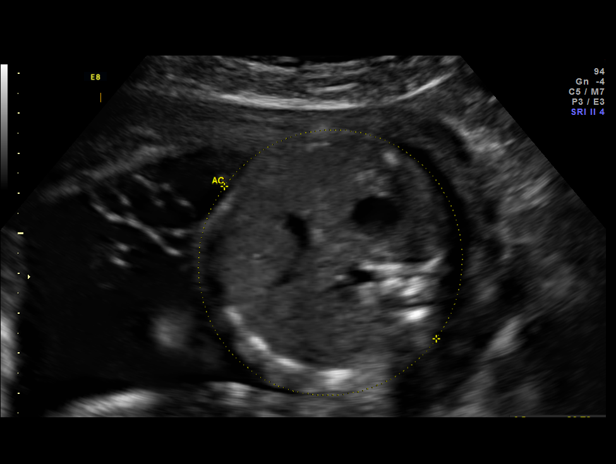
[im 42/63]
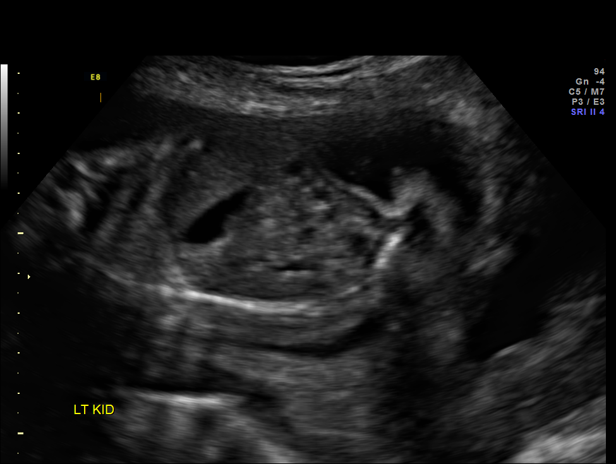
[im 46/63]
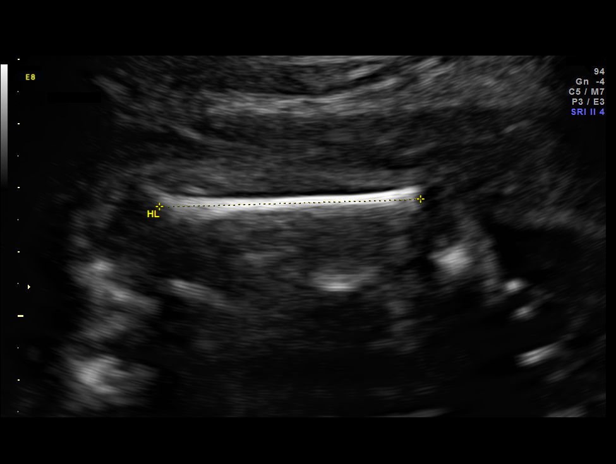
[im 49/63]
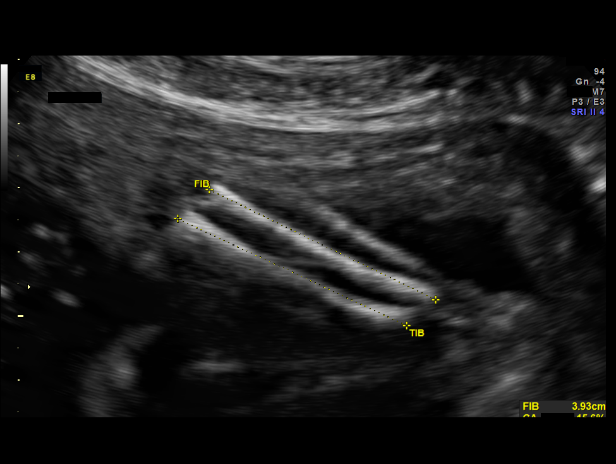
[im 53/63]
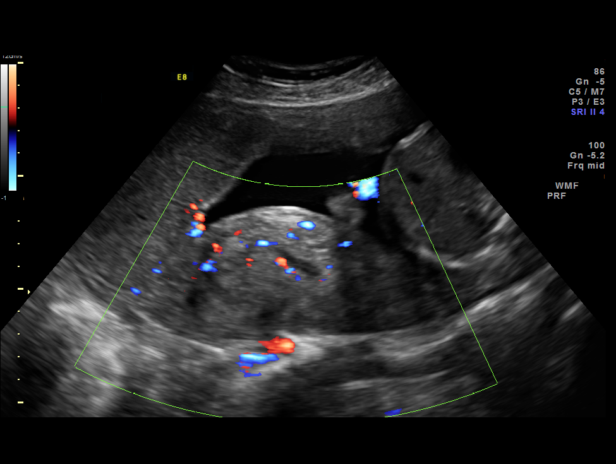
[im 58/63]
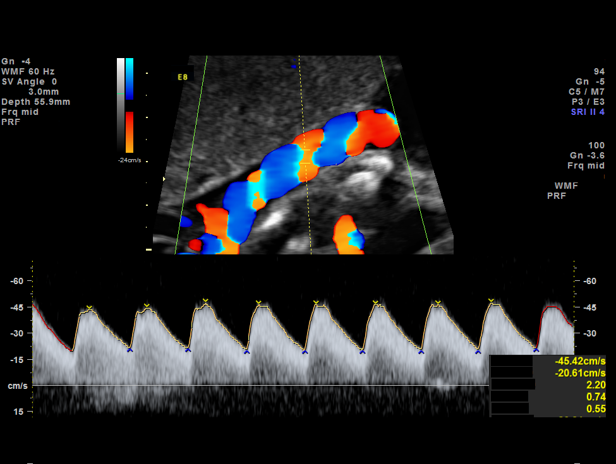
[im 63/63]
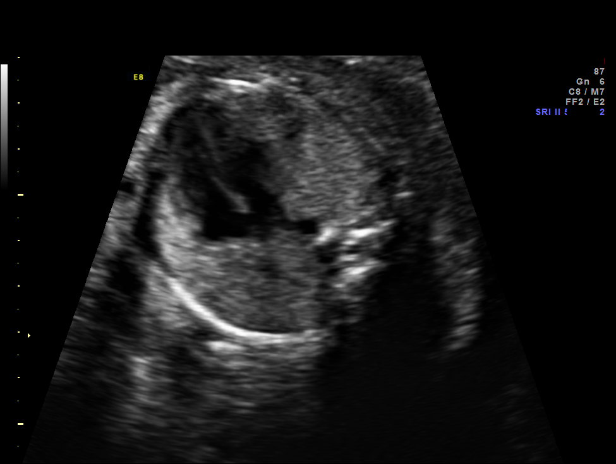

[16 of 28 positions shown; findings below may reference images not displayed]

OBSTETRICS REPORT
                      (Signed Final 11/19/2013 [DATE])

Service(s) Provided

 US OB FOLLOW UP                                       76816.1
 US UA CORD DOPPLER                                    76820.0
Indications

 Size less than dates (Small for gestational age,
 FGR)
 2 vessel umbilical cord
 Advanced maternal age primigravida (33), second
 trimester (declined genetic screening)
 Hypothyroid (on synthroid)
 26 weeks gestation of pregnancy
Fetal Evaluation

 Num Of Fetuses:    1
 Fetal Heart Rate:  148                          bpm
 Cardiac Activity:  Observed
 Presentation:      Breech
 Placenta:          Anterior right, above
                    cervical os
 P. Cord            Previously Visualized
 Insertion:

 Amniotic Fluid
 AFI FV:      Subjectively within normal limits
                                             Larg Pckt:     6.0  cm
Biometry

 BPD:     62.1  mm     G. Age:  25w 2d                CI:         72.0   70 - 86
 OFD:     86.3  mm                                    FL/HC:      18.6   18.6 -

 HC:     237.6  mm     G. Age:  25w 6d        7  %    HC/AC:      1.12   1.05 -

 AC:     211.9  mm     G. Age:  25w 5d       16  %    FL/BPD:     71.2   71 - 87
 FL:      44.2  mm     G. Age:  24w 4d      < 3  %    FL/AC:      20.9   20 - 24
 HUM:       40  mm     G. Age:  24w 2d      < 5  %
 ULN:     38.8  mm     G. Age:  25w 4d        9  %
 TIB:     39.4  mm     G. Age:  25w 0d        8  %
 RAD:     34.4  mm     G. Age:  24w 1d       21  %
 FIB:     39.3  mm     G. Age:  24w 5d       42  %

 Est. FW:     791  gm    1 lb 12 oz      24  %
Gestational Age

 LMP:           26w 5d        Date:  05/16/13                 EDD:   02/20/14
 U/S Today:     25w 3d                                        EDD:   03/01/14
 Best:          26w 5d     Det. By:  LMP  (05/16/13)          EDD:   02/20/14
Anatomy

 Cranium:          Appears normal         Aortic Arch:      Previously seen
 Fetal Cavum:      Appears normal         Ductal Arch:      Previously seen
 Ventricles:       Appears normal         Diaphragm:        Previously seen
 Choroid Plexus:   Previously seen        Stomach:          Appears normal, left
                                                            sided
 Cerebellum:       Previously seen        Abdomen:          Appears normal
 Posterior Fossa:  Previously seen        Abdominal Wall:   Previously seen
 Nuchal Fold:      Not applicable (>20    Cord Vessels:     2 vessel cord,
                   wks GA)
                                                            absent right Paulus N
                                                            [place]
 Face:             Orbits and profile     Kidneys:          Appear normal
                   previously seen
 Lips:             Previously seen        Bladder:          Appears normal
 Heart:            Appears normal         Spine:            Not well visualized
                   (4CH, axis, and
                   situs)
 RVOT:             Previously seen        Lower             Previously seen
                                          Extremities:
 LVOT:             Previously seen        Upper             Previously seen
                                          Extremities:

 Other:  Female gender previously seen. Heels and 5th digit previously seen.
         Technically difficult due to fetal position.
Doppler - Fetal Vessels

 Umbilical Artery
 S/D:   2.49           16  %tile       RI:
 PI:    0.84                           PSV:       53.23   cm/s
 Umbilical Artery
 Absent DFV:    No     Reverse DFV:    No

Cervix Uterus Adnexa

 Cervical Length:    3.8      cm

 Cervix:       Normal appearance by transabdominal scan.
 Left Ovary:    Within normal limits.
 Right Ovary:   Not visualized.
 Adnexa:     No abnormality visualized.
Myomas

 Site                     L(cm)      W(cm)       D(cm)      Location
 Anterior

 Blood Flow                  RI       PI       Comments
Impression

 Single IUP at 26w 5d
 Follow up due to single umbilical artery, lagging growth,
 advanced maternal age (declined genetic testing)
 Fetal echo performed - per report was normal, but final report
 currently pending
 A single umbilical artery is again noted
 The remainder of the fetal anatomy is within normal limits
 The overall estimated fetal weight today is at the 24th %tile.
 The AC measures at the 16th %tile.
 The long bones are mostly < 5th %tile, but appear
 morpholocically normal - likely growth issue, skeletal
 dysplasia very unlikely
 Normal UA Doppler studies for gestational age
 Normal amniotic fluid volume
Recommendations

 Recommend follow-up ultrasound examination in 3 weeks for
 interval growth.

 questions or concerns.

## 2016-03-25 DIAGNOSIS — M6240 Contracture of muscle, unspecified site: Secondary | ICD-10-CM | POA: Diagnosis not present

## 2016-03-25 DIAGNOSIS — M9901 Segmental and somatic dysfunction of cervical region: Secondary | ICD-10-CM | POA: Diagnosis not present

## 2016-03-25 DIAGNOSIS — M9902 Segmental and somatic dysfunction of thoracic region: Secondary | ICD-10-CM | POA: Diagnosis not present

## 2016-03-28 DIAGNOSIS — M6240 Contracture of muscle, unspecified site: Secondary | ICD-10-CM | POA: Diagnosis not present

## 2016-03-28 DIAGNOSIS — M9901 Segmental and somatic dysfunction of cervical region: Secondary | ICD-10-CM | POA: Diagnosis not present

## 2016-03-28 DIAGNOSIS — M9903 Segmental and somatic dysfunction of lumbar region: Secondary | ICD-10-CM | POA: Diagnosis not present

## 2016-03-28 DIAGNOSIS — M9902 Segmental and somatic dysfunction of thoracic region: Secondary | ICD-10-CM | POA: Diagnosis not present

## 2016-04-15 DIAGNOSIS — E039 Hypothyroidism, unspecified: Secondary | ICD-10-CM | POA: Diagnosis not present

## 2016-04-15 DIAGNOSIS — Z6823 Body mass index (BMI) 23.0-23.9, adult: Secondary | ICD-10-CM | POA: Diagnosis not present

## 2016-04-15 DIAGNOSIS — N979 Female infertility, unspecified: Secondary | ICD-10-CM | POA: Diagnosis not present

## 2016-04-18 DIAGNOSIS — M9902 Segmental and somatic dysfunction of thoracic region: Secondary | ICD-10-CM | POA: Diagnosis not present

## 2016-04-18 DIAGNOSIS — M6240 Contracture of muscle, unspecified site: Secondary | ICD-10-CM | POA: Diagnosis not present

## 2016-04-18 DIAGNOSIS — M9901 Segmental and somatic dysfunction of cervical region: Secondary | ICD-10-CM | POA: Diagnosis not present

## 2016-04-18 DIAGNOSIS — M9903 Segmental and somatic dysfunction of lumbar region: Secondary | ICD-10-CM | POA: Diagnosis not present

## 2016-04-22 DIAGNOSIS — N979 Female infertility, unspecified: Secondary | ICD-10-CM | POA: Diagnosis not present

## 2016-04-22 DIAGNOSIS — D259 Leiomyoma of uterus, unspecified: Secondary | ICD-10-CM | POA: Diagnosis not present

## 2016-04-25 DIAGNOSIS — M9903 Segmental and somatic dysfunction of lumbar region: Secondary | ICD-10-CM | POA: Diagnosis not present

## 2016-04-25 DIAGNOSIS — M5137 Other intervertebral disc degeneration, lumbosacral region: Secondary | ICD-10-CM | POA: Diagnosis not present

## 2016-04-25 DIAGNOSIS — M9905 Segmental and somatic dysfunction of pelvic region: Secondary | ICD-10-CM | POA: Diagnosis not present

## 2016-04-25 DIAGNOSIS — M9901 Segmental and somatic dysfunction of cervical region: Secondary | ICD-10-CM | POA: Diagnosis not present

## 2016-05-02 DIAGNOSIS — N979 Female infertility, unspecified: Secondary | ICD-10-CM | POA: Diagnosis not present

## 2016-05-09 DIAGNOSIS — M5137 Other intervertebral disc degeneration, lumbosacral region: Secondary | ICD-10-CM | POA: Diagnosis not present

## 2016-05-09 DIAGNOSIS — M9905 Segmental and somatic dysfunction of pelvic region: Secondary | ICD-10-CM | POA: Diagnosis not present

## 2016-05-09 DIAGNOSIS — M9901 Segmental and somatic dysfunction of cervical region: Secondary | ICD-10-CM | POA: Diagnosis not present

## 2016-05-09 DIAGNOSIS — M9903 Segmental and somatic dysfunction of lumbar region: Secondary | ICD-10-CM | POA: Diagnosis not present

## 2016-05-16 DIAGNOSIS — M9903 Segmental and somatic dysfunction of lumbar region: Secondary | ICD-10-CM | POA: Diagnosis not present

## 2016-05-16 DIAGNOSIS — N979 Female infertility, unspecified: Secondary | ICD-10-CM | POA: Diagnosis not present

## 2016-05-16 DIAGNOSIS — M5137 Other intervertebral disc degeneration, lumbosacral region: Secondary | ICD-10-CM | POA: Diagnosis not present

## 2016-05-16 DIAGNOSIS — M9905 Segmental and somatic dysfunction of pelvic region: Secondary | ICD-10-CM | POA: Diagnosis not present

## 2016-05-16 DIAGNOSIS — M9901 Segmental and somatic dysfunction of cervical region: Secondary | ICD-10-CM | POA: Diagnosis not present

## 2016-05-19 DIAGNOSIS — Z3189 Encounter for other procreative management: Secondary | ICD-10-CM | POA: Diagnosis not present

## 2016-05-27 DIAGNOSIS — R251 Tremor, unspecified: Secondary | ICD-10-CM | POA: Diagnosis not present

## 2016-05-27 DIAGNOSIS — B349 Viral infection, unspecified: Secondary | ICD-10-CM | POA: Diagnosis not present

## 2016-05-27 DIAGNOSIS — R079 Chest pain, unspecified: Secondary | ICD-10-CM | POA: Diagnosis not present

## 2016-06-05 DIAGNOSIS — J029 Acute pharyngitis, unspecified: Secondary | ICD-10-CM | POA: Diagnosis not present

## 2016-07-12 DIAGNOSIS — M9903 Segmental and somatic dysfunction of lumbar region: Secondary | ICD-10-CM | POA: Diagnosis not present

## 2016-07-12 DIAGNOSIS — M9901 Segmental and somatic dysfunction of cervical region: Secondary | ICD-10-CM | POA: Diagnosis not present

## 2016-07-12 DIAGNOSIS — M5137 Other intervertebral disc degeneration, lumbosacral region: Secondary | ICD-10-CM | POA: Diagnosis not present

## 2016-07-12 DIAGNOSIS — M9905 Segmental and somatic dysfunction of pelvic region: Secondary | ICD-10-CM | POA: Diagnosis not present

## 2016-07-14 DIAGNOSIS — M9903 Segmental and somatic dysfunction of lumbar region: Secondary | ICD-10-CM | POA: Diagnosis not present

## 2016-07-14 DIAGNOSIS — M9905 Segmental and somatic dysfunction of pelvic region: Secondary | ICD-10-CM | POA: Diagnosis not present

## 2016-07-14 DIAGNOSIS — M9901 Segmental and somatic dysfunction of cervical region: Secondary | ICD-10-CM | POA: Diagnosis not present

## 2016-07-14 DIAGNOSIS — M5137 Other intervertebral disc degeneration, lumbosacral region: Secondary | ICD-10-CM | POA: Diagnosis not present

## 2016-07-29 ENCOUNTER — Ambulatory Visit: Payer: BLUE CROSS/BLUE SHIELD

## 2016-07-29 ENCOUNTER — Ambulatory Visit (INDEPENDENT_AMBULATORY_CARE_PROVIDER_SITE_OTHER): Payer: BLUE CROSS/BLUE SHIELD

## 2016-07-29 ENCOUNTER — Ambulatory Visit (INDEPENDENT_AMBULATORY_CARE_PROVIDER_SITE_OTHER): Payer: BLUE CROSS/BLUE SHIELD | Admitting: Podiatry

## 2016-07-29 VITALS — BP 109/65 | HR 67 | Resp 16

## 2016-07-29 DIAGNOSIS — M79671 Pain in right foot: Secondary | ICD-10-CM | POA: Diagnosis not present

## 2016-07-29 DIAGNOSIS — M79672 Pain in left foot: Secondary | ICD-10-CM

## 2016-07-29 DIAGNOSIS — M729 Fibroblastic disorder, unspecified: Secondary | ICD-10-CM

## 2016-07-29 DIAGNOSIS — M779 Enthesopathy, unspecified: Secondary | ICD-10-CM | POA: Diagnosis not present

## 2016-07-29 MED ORDER — TRIAMCINOLONE ACETONIDE 10 MG/ML IJ SUSP
10.0000 mg | Freq: Once | INTRAMUSCULAR | Status: AC
  Administered 2016-07-29: 10 mg

## 2016-07-29 MED ORDER — DICLOFENAC SODIUM 75 MG PO TBEC: 75.0000 mg | DELAYED_RELEASE_TABLET | Freq: Two times a day (BID) | ORAL | 2 refills | Status: AC

## 2016-07-29 NOTE — Progress Notes (Signed)
   Subjective:    Patient ID: [name] [name], female    DOB: 1980/10/20, 36 y.o.   MRN: [id number]  HPI Chief Complaint  Patient presents with  . Foot Pain    Left foot; arch & dorsal; pt stated, "Pain radiates up to the ankle; had hammertoe surgery on Left foot in 2006 or 2007 with Dr. Blenda Mounts;" x1 month  . Callouses    Left foot; plantar forefoot-below 5th toe      Review of Systems  All other systems reviewed and are negative.      Objective:   Physical Exam        Assessment & Plan:

## 2016-07-29 NOTE — Progress Notes (Signed)
Subjective:    Patient ID: [name] [name], female   DOB: 36 y.o.   MRN: [id number]   HPI patient states that she has pain in the top of the left foot a lesion underneath the fourth metatarsal left and no she needs new orthotics as she had them years ago and they have worn out    Review of Systems  All other systems reviewed and are negative.       Objective:  Physical Exam  Cardiovascular: Intact distal pulses.   Musculoskeletal: Normal range of motion.  Neurological: She is alert.  Skin: Skin is warm.  Nursing note and vitals reviewed.  neurovascular status found to be intact with muscle strength adequate range of motion within normal limits with patient noted to have discomfort in the dorsal lateral aspect left foot moderate discomfort in the plantar arch left with history of breaking metatarsal was shortened fourth metatarsals     Assessment:    Probable inflammatory tendinitis dorsal lateral aspect left foot with mild fasciitis plantar and prominent metatarsal fourth left     Plan:    H&P all conditions reviewed and today I did a dorsal tendon injection left 3 mg Kenalog 5 mill grams Xylocaine and applied fascial brace to lift the arch instructed on ice supportive shoe gear and discussed long-term orthotics. Also debrided the callus fourth metatarsal and we'll see patient back in 2 weeks to reevaluate how she responded with long-term orthotics recommended  X-rays indicate that there is a short fourth metatarsal with previous history of surgery on the left fourth metatarsal with it being longer than the right with no indication of stress fracture

## 2016-08-12 ENCOUNTER — Ambulatory Visit (INDEPENDENT_AMBULATORY_CARE_PROVIDER_SITE_OTHER): Payer: BLUE CROSS/BLUE SHIELD | Admitting: Podiatry

## 2016-08-12 DIAGNOSIS — M9903 Segmental and somatic dysfunction of lumbar region: Secondary | ICD-10-CM | POA: Diagnosis not present

## 2016-08-12 DIAGNOSIS — M9905 Segmental and somatic dysfunction of pelvic region: Secondary | ICD-10-CM | POA: Diagnosis not present

## 2016-08-12 DIAGNOSIS — M779 Enthesopathy, unspecified: Secondary | ICD-10-CM | POA: Diagnosis not present

## 2016-08-12 DIAGNOSIS — M9901 Segmental and somatic dysfunction of cervical region: Secondary | ICD-10-CM | POA: Diagnosis not present

## 2016-08-12 DIAGNOSIS — M729 Fibroblastic disorder, unspecified: Secondary | ICD-10-CM

## 2016-08-12 DIAGNOSIS — M5137 Other intervertebral disc degeneration, lumbosacral region: Secondary | ICD-10-CM | POA: Diagnosis not present

## 2016-08-12 NOTE — Progress Notes (Signed)
Subjective:    Patient ID: [name] [name], female   DOB: 36 y.o.   MRN: [id number]   HPI patient states she still having moderate pain and states she no she needs new orthotics as they've been very beneficial in the past    ROS      Objective:  Physical Exam neurovascular status intact with patient found to have sub-fourth metatarsal pain still present with inflammation along the lateral side left foot and moderate flatfoot deformity     Assessment:    Tendinitis with capsulitis infiltration and plantarflex met     Plan:   H&P conditions reviewed and recommended long-term orthotics and scanned for custom orthotics to reduce plantar pressure and lift the arch bilateral. Patient will be seen back 3 weeks or earlier if needed

## 2016-09-02 ENCOUNTER — Ambulatory Visit (INDEPENDENT_AMBULATORY_CARE_PROVIDER_SITE_OTHER): Payer: BLUE CROSS/BLUE SHIELD

## 2016-09-02 DIAGNOSIS — M779 Enthesopathy, unspecified: Secondary | ICD-10-CM

## 2016-09-02 NOTE — Progress Notes (Signed)
Patient presents for orthotic pick up.  Verbal and written break in and wear instructions given.  Patient will follow up in 4 weeks if symptoms worsen or fail to improve. 

## 2016-09-02 NOTE — Patient Instructions (Signed)

## 2016-09-13 DIAGNOSIS — M9903 Segmental and somatic dysfunction of lumbar region: Secondary | ICD-10-CM | POA: Diagnosis not present

## 2016-09-13 DIAGNOSIS — M9905 Segmental and somatic dysfunction of pelvic region: Secondary | ICD-10-CM | POA: Diagnosis not present

## 2016-09-13 DIAGNOSIS — M9901 Segmental and somatic dysfunction of cervical region: Secondary | ICD-10-CM | POA: Diagnosis not present

## 2016-09-13 DIAGNOSIS — M5137 Other intervertebral disc degeneration, lumbosacral region: Secondary | ICD-10-CM | POA: Diagnosis not present

## 2016-10-14 DIAGNOSIS — Z3200 Encounter for pregnancy test, result unknown: Secondary | ICD-10-CM | POA: Diagnosis not present

## 2016-10-14 DIAGNOSIS — N926 Irregular menstruation, unspecified: Secondary | ICD-10-CM | POA: Diagnosis not present

## 2016-10-28 DIAGNOSIS — M9901 Segmental and somatic dysfunction of cervical region: Secondary | ICD-10-CM | POA: Diagnosis not present

## 2016-10-28 DIAGNOSIS — M5137 Other intervertebral disc degeneration, lumbosacral region: Secondary | ICD-10-CM | POA: Diagnosis not present

## 2016-10-28 DIAGNOSIS — M9903 Segmental and somatic dysfunction of lumbar region: Secondary | ICD-10-CM | POA: Diagnosis not present

## 2016-10-28 DIAGNOSIS — M9905 Segmental and somatic dysfunction of pelvic region: Secondary | ICD-10-CM | POA: Diagnosis not present

## 2016-12-02 DIAGNOSIS — M9903 Segmental and somatic dysfunction of lumbar region: Secondary | ICD-10-CM | POA: Diagnosis not present

## 2016-12-02 DIAGNOSIS — M9901 Segmental and somatic dysfunction of cervical region: Secondary | ICD-10-CM | POA: Diagnosis not present

## 2016-12-02 DIAGNOSIS — M5137 Other intervertebral disc degeneration, lumbosacral region: Secondary | ICD-10-CM | POA: Diagnosis not present

## 2016-12-02 DIAGNOSIS — M9905 Segmental and somatic dysfunction of pelvic region: Secondary | ICD-10-CM | POA: Diagnosis not present

## 2017-02-24 DIAGNOSIS — R05 Cough: Secondary | ICD-10-CM | POA: Diagnosis not present

## 2017-02-24 DIAGNOSIS — J339 Nasal polyp, unspecified: Secondary | ICD-10-CM | POA: Diagnosis not present

## 2017-02-24 DIAGNOSIS — N809 Endometriosis, unspecified: Secondary | ICD-10-CM | POA: Diagnosis not present

## 2017-06-07 DIAGNOSIS — R591 Generalized enlarged lymph nodes: Secondary | ICD-10-CM | POA: Diagnosis not present

## 2017-06-07 DIAGNOSIS — J301 Allergic rhinitis due to pollen: Secondary | ICD-10-CM | POA: Diagnosis not present

## 2017-06-07 DIAGNOSIS — J339 Nasal polyp, unspecified: Secondary | ICD-10-CM | POA: Diagnosis not present

## 2017-06-27 DIAGNOSIS — N809 Endometriosis, unspecified: Secondary | ICD-10-CM | POA: Diagnosis not present

## 2017-06-27 DIAGNOSIS — N939 Abnormal uterine and vaginal bleeding, unspecified: Secondary | ICD-10-CM | POA: Diagnosis not present

## 2017-06-27 DIAGNOSIS — R109 Unspecified abdominal pain: Secondary | ICD-10-CM | POA: Diagnosis not present

## 2017-06-28 DIAGNOSIS — N941 Unspecified dyspareunia: Secondary | ICD-10-CM | POA: Diagnosis not present

## 2017-06-28 DIAGNOSIS — N946 Dysmenorrhea, unspecified: Secondary | ICD-10-CM | POA: Diagnosis not present

## 2017-07-18 DIAGNOSIS — N944 Primary dysmenorrhea: Secondary | ICD-10-CM | POA: Diagnosis not present

## 2017-07-18 DIAGNOSIS — R102 Pelvic and perineal pain: Secondary | ICD-10-CM | POA: Diagnosis not present

## 2017-07-18 DIAGNOSIS — D259 Leiomyoma of uterus, unspecified: Secondary | ICD-10-CM | POA: Diagnosis not present

## 2017-10-06 DIAGNOSIS — F4321 Adjustment disorder with depressed mood: Secondary | ICD-10-CM | POA: Diagnosis not present

## 2017-10-20 DIAGNOSIS — F4321 Adjustment disorder with depressed mood: Secondary | ICD-10-CM | POA: Diagnosis not present

## 2017-11-01 DIAGNOSIS — N803 Endometriosis of pelvic peritoneum: Secondary | ICD-10-CM | POA: Diagnosis not present

## 2017-11-01 DIAGNOSIS — Z319 Encounter for procreative management, unspecified: Secondary | ICD-10-CM | POA: Diagnosis not present

## 2017-11-01 DIAGNOSIS — D259 Leiomyoma of uterus, unspecified: Secondary | ICD-10-CM | POA: Diagnosis not present

## 2017-11-03 DIAGNOSIS — F4321 Adjustment disorder with depressed mood: Secondary | ICD-10-CM | POA: Diagnosis not present

## 2017-11-13 DIAGNOSIS — A084 Viral intestinal infection, unspecified: Secondary | ICD-10-CM | POA: Diagnosis not present

## 2017-11-17 DIAGNOSIS — Z3141 Encounter for fertility testing: Secondary | ICD-10-CM | POA: Diagnosis not present

## 2017-11-17 DIAGNOSIS — N803 Endometriosis of pelvic peritoneum: Secondary | ICD-10-CM | POA: Diagnosis not present

## 2017-11-17 DIAGNOSIS — D259 Leiomyoma of uterus, unspecified: Secondary | ICD-10-CM | POA: Diagnosis not present

## 2017-11-17 DIAGNOSIS — Z319 Encounter for procreative management, unspecified: Secondary | ICD-10-CM | POA: Diagnosis not present

## 2017-12-11 DIAGNOSIS — N803 Endometriosis of pelvic peritoneum: Secondary | ICD-10-CM | POA: Diagnosis not present

## 2017-12-11 DIAGNOSIS — D259 Leiomyoma of uterus, unspecified: Secondary | ICD-10-CM | POA: Diagnosis not present

## 2017-12-11 DIAGNOSIS — Z319 Encounter for procreative management, unspecified: Secondary | ICD-10-CM | POA: Diagnosis not present

## 2017-12-15 DIAGNOSIS — Z3189 Encounter for other procreative management: Secondary | ICD-10-CM | POA: Diagnosis not present

## 2018-02-16 DIAGNOSIS — D649 Anemia, unspecified: Secondary | ICD-10-CM | POA: Diagnosis not present

## 2018-04-06 DIAGNOSIS — Z319 Encounter for procreative management, unspecified: Secondary | ICD-10-CM | POA: Diagnosis not present

## 2018-04-06 DIAGNOSIS — D259 Leiomyoma of uterus, unspecified: Secondary | ICD-10-CM | POA: Diagnosis not present

## 2018-04-06 DIAGNOSIS — N803 Endometriosis of pelvic peritoneum: Secondary | ICD-10-CM | POA: Diagnosis not present

## 2018-09-09 DIAGNOSIS — Y92410 Unspecified street and highway as the place of occurrence of the external cause: Secondary | ICD-10-CM | POA: Diagnosis not present

## 2018-09-09 DIAGNOSIS — S161XXA Strain of muscle, fascia and tendon at neck level, initial encounter: Secondary | ICD-10-CM | POA: Diagnosis not present

## 2018-09-09 DIAGNOSIS — M25511 Pain in right shoulder: Secondary | ICD-10-CM | POA: Diagnosis not present

## 2018-09-09 DIAGNOSIS — M545 Low back pain: Secondary | ICD-10-CM | POA: Diagnosis not present

## 2018-09-09 DIAGNOSIS — S7001XA Contusion of right hip, initial encounter: Secondary | ICD-10-CM | POA: Diagnosis not present

## 2018-09-09 DIAGNOSIS — S40011A Contusion of right shoulder, initial encounter: Secondary | ICD-10-CM | POA: Diagnosis not present

## 2018-09-09 DIAGNOSIS — M25551 Pain in right hip: Secondary | ICD-10-CM | POA: Diagnosis not present

## 2018-09-09 DIAGNOSIS — T1490XA Injury, unspecified, initial encounter: Secondary | ICD-10-CM | POA: Diagnosis not present

## 2018-09-09 DIAGNOSIS — M542 Cervicalgia: Secondary | ICD-10-CM | POA: Diagnosis not present

## 2018-09-09 DIAGNOSIS — R51 Headache: Secondary | ICD-10-CM | POA: Diagnosis not present

## 2018-09-20 DIAGNOSIS — M545 Low back pain: Secondary | ICD-10-CM | POA: Diagnosis not present

## 2018-09-20 DIAGNOSIS — M542 Cervicalgia: Secondary | ICD-10-CM | POA: Diagnosis not present

## 2018-09-24 DIAGNOSIS — M545 Low back pain: Secondary | ICD-10-CM | POA: Diagnosis not present

## 2018-09-24 DIAGNOSIS — M542 Cervicalgia: Secondary | ICD-10-CM | POA: Diagnosis not present

## 2018-09-27 DIAGNOSIS — M542 Cervicalgia: Secondary | ICD-10-CM | POA: Diagnosis not present

## 2018-09-27 DIAGNOSIS — M545 Low back pain: Secondary | ICD-10-CM | POA: Diagnosis not present

## 2018-10-02 DIAGNOSIS — S161XXD Strain of muscle, fascia and tendon at neck level, subsequent encounter: Secondary | ICD-10-CM | POA: Diagnosis not present

## 2018-10-02 DIAGNOSIS — S39012D Strain of muscle, fascia and tendon of lower back, subsequent encounter: Secondary | ICD-10-CM | POA: Diagnosis not present

## 2018-10-03 DIAGNOSIS — S39012D Strain of muscle, fascia and tendon of lower back, subsequent encounter: Secondary | ICD-10-CM | POA: Diagnosis not present

## 2018-10-03 DIAGNOSIS — S161XXD Strain of muscle, fascia and tendon at neck level, subsequent encounter: Secondary | ICD-10-CM | POA: Diagnosis not present

## 2018-10-04 DIAGNOSIS — S39012D Strain of muscle, fascia and tendon of lower back, subsequent encounter: Secondary | ICD-10-CM | POA: Diagnosis not present

## 2018-10-04 DIAGNOSIS — S161XXD Strain of muscle, fascia and tendon at neck level, subsequent encounter: Secondary | ICD-10-CM | POA: Diagnosis not present

## 2018-10-09 DIAGNOSIS — S161XXD Strain of muscle, fascia and tendon at neck level, subsequent encounter: Secondary | ICD-10-CM | POA: Diagnosis not present

## 2018-10-09 DIAGNOSIS — S39012D Strain of muscle, fascia and tendon of lower back, subsequent encounter: Secondary | ICD-10-CM | POA: Diagnosis not present

## 2018-10-11 DIAGNOSIS — S161XXD Strain of muscle, fascia and tendon at neck level, subsequent encounter: Secondary | ICD-10-CM | POA: Diagnosis not present

## 2018-10-11 DIAGNOSIS — S39012D Strain of muscle, fascia and tendon of lower back, subsequent encounter: Secondary | ICD-10-CM | POA: Diagnosis not present

## 2018-10-15 DIAGNOSIS — S161XXD Strain of muscle, fascia and tendon at neck level, subsequent encounter: Secondary | ICD-10-CM | POA: Diagnosis not present

## 2018-10-15 DIAGNOSIS — S39012D Strain of muscle, fascia and tendon of lower back, subsequent encounter: Secondary | ICD-10-CM | POA: Diagnosis not present

## 2018-10-16 DIAGNOSIS — S39012D Strain of muscle, fascia and tendon of lower back, subsequent encounter: Secondary | ICD-10-CM | POA: Diagnosis not present

## 2018-10-16 DIAGNOSIS — S161XXD Strain of muscle, fascia and tendon at neck level, subsequent encounter: Secondary | ICD-10-CM | POA: Diagnosis not present

## 2018-10-24 DIAGNOSIS — S161XXD Strain of muscle, fascia and tendon at neck level, subsequent encounter: Secondary | ICD-10-CM | POA: Diagnosis not present

## 2018-10-24 DIAGNOSIS — S39012D Strain of muscle, fascia and tendon of lower back, subsequent encounter: Secondary | ICD-10-CM | POA: Diagnosis not present

## 2018-10-25 DIAGNOSIS — S161XXD Strain of muscle, fascia and tendon at neck level, subsequent encounter: Secondary | ICD-10-CM | POA: Diagnosis not present

## 2018-10-25 DIAGNOSIS — S39012D Strain of muscle, fascia and tendon of lower back, subsequent encounter: Secondary | ICD-10-CM | POA: Diagnosis not present

## 2018-10-29 DIAGNOSIS — S335XXD Sprain of ligaments of lumbar spine, subsequent encounter: Secondary | ICD-10-CM | POA: Diagnosis not present

## 2018-10-29 DIAGNOSIS — S134XXD Sprain of ligaments of cervical spine, subsequent encounter: Secondary | ICD-10-CM | POA: Diagnosis not present

## 2018-10-30 DIAGNOSIS — S161XXD Strain of muscle, fascia and tendon at neck level, subsequent encounter: Secondary | ICD-10-CM | POA: Diagnosis not present

## 2018-10-30 DIAGNOSIS — S39012D Strain of muscle, fascia and tendon of lower back, subsequent encounter: Secondary | ICD-10-CM | POA: Diagnosis not present

## 2018-11-01 DIAGNOSIS — S161XXD Strain of muscle, fascia and tendon at neck level, subsequent encounter: Secondary | ICD-10-CM | POA: Diagnosis not present

## 2018-11-01 DIAGNOSIS — S39012D Strain of muscle, fascia and tendon of lower back, subsequent encounter: Secondary | ICD-10-CM | POA: Diagnosis not present

## 2018-11-06 DIAGNOSIS — S161XXD Strain of muscle, fascia and tendon at neck level, subsequent encounter: Secondary | ICD-10-CM | POA: Diagnosis not present

## 2018-11-06 DIAGNOSIS — S39012D Strain of muscle, fascia and tendon of lower back, subsequent encounter: Secondary | ICD-10-CM | POA: Diagnosis not present

## 2018-11-08 DIAGNOSIS — S39012D Strain of muscle, fascia and tendon of lower back, subsequent encounter: Secondary | ICD-10-CM | POA: Diagnosis not present

## 2018-11-08 DIAGNOSIS — S161XXD Strain of muscle, fascia and tendon at neck level, subsequent encounter: Secondary | ICD-10-CM | POA: Diagnosis not present

## 2018-11-13 DIAGNOSIS — S39012D Strain of muscle, fascia and tendon of lower back, subsequent encounter: Secondary | ICD-10-CM | POA: Diagnosis not present

## 2018-11-13 DIAGNOSIS — S161XXD Strain of muscle, fascia and tendon at neck level, subsequent encounter: Secondary | ICD-10-CM | POA: Diagnosis not present

## 2018-11-14 DIAGNOSIS — S39012D Strain of muscle, fascia and tendon of lower back, subsequent encounter: Secondary | ICD-10-CM | POA: Diagnosis not present

## 2018-11-14 DIAGNOSIS — S161XXD Strain of muscle, fascia and tendon at neck level, subsequent encounter: Secondary | ICD-10-CM | POA: Diagnosis not present

## 2018-11-16 DIAGNOSIS — N809 Endometriosis, unspecified: Secondary | ICD-10-CM | POA: Diagnosis not present

## 2018-11-16 DIAGNOSIS — N946 Dysmenorrhea, unspecified: Secondary | ICD-10-CM | POA: Diagnosis not present

## 2018-11-22 DIAGNOSIS — S39012D Strain of muscle, fascia and tendon of lower back, subsequent encounter: Secondary | ICD-10-CM | POA: Diagnosis not present

## 2018-11-22 DIAGNOSIS — S161XXD Strain of muscle, fascia and tendon at neck level, subsequent encounter: Secondary | ICD-10-CM | POA: Diagnosis not present

## 2018-11-26 DIAGNOSIS — S39012D Strain of muscle, fascia and tendon of lower back, subsequent encounter: Secondary | ICD-10-CM | POA: Diagnosis not present

## 2018-11-26 DIAGNOSIS — S161XXD Strain of muscle, fascia and tendon at neck level, subsequent encounter: Secondary | ICD-10-CM | POA: Diagnosis not present

## 2018-11-29 DIAGNOSIS — S161XXD Strain of muscle, fascia and tendon at neck level, subsequent encounter: Secondary | ICD-10-CM | POA: Diagnosis not present

## 2018-11-29 DIAGNOSIS — S39012D Strain of muscle, fascia and tendon of lower back, subsequent encounter: Secondary | ICD-10-CM | POA: Diagnosis not present

## 2018-12-04 DIAGNOSIS — S161XXD Strain of muscle, fascia and tendon at neck level, subsequent encounter: Secondary | ICD-10-CM | POA: Diagnosis not present

## 2018-12-04 DIAGNOSIS — S39012D Strain of muscle, fascia and tendon of lower back, subsequent encounter: Secondary | ICD-10-CM | POA: Diagnosis not present

## 2018-12-12 DIAGNOSIS — D649 Anemia, unspecified: Secondary | ICD-10-CM | POA: Diagnosis not present

## 2018-12-12 DIAGNOSIS — R079 Chest pain, unspecified: Secondary | ICD-10-CM | POA: Diagnosis not present

## 2018-12-12 DIAGNOSIS — R1031 Right lower quadrant pain: Secondary | ICD-10-CM | POA: Diagnosis not present

## 2018-12-12 DIAGNOSIS — K429 Umbilical hernia without obstruction or gangrene: Secondary | ICD-10-CM | POA: Diagnosis not present

## 2018-12-12 DIAGNOSIS — R7989 Other specified abnormal findings of blood chemistry: Secondary | ICD-10-CM | POA: Diagnosis not present

## 2018-12-12 DIAGNOSIS — R531 Weakness: Secondary | ICD-10-CM | POA: Diagnosis not present

## 2018-12-12 DIAGNOSIS — Z20828 Contact with and (suspected) exposure to other viral communicable diseases: Secondary | ICD-10-CM | POA: Diagnosis not present

## 2018-12-12 DIAGNOSIS — Z880 Allergy status to penicillin: Secondary | ICD-10-CM | POA: Diagnosis not present

## 2018-12-12 DIAGNOSIS — R0789 Other chest pain: Secondary | ICD-10-CM | POA: Diagnosis not present

## 2018-12-12 DIAGNOSIS — R5383 Other fatigue: Secondary | ICD-10-CM | POA: Diagnosis not present

## 2018-12-12 DIAGNOSIS — N939 Abnormal uterine and vaginal bleeding, unspecified: Secondary | ICD-10-CM | POA: Diagnosis not present

## 2018-12-12 DIAGNOSIS — D259 Leiomyoma of uterus, unspecified: Secondary | ICD-10-CM | POA: Diagnosis not present

## 2018-12-12 DIAGNOSIS — R519 Headache, unspecified: Secondary | ICD-10-CM | POA: Diagnosis not present

## 2018-12-12 DIAGNOSIS — Z888 Allergy status to other drugs, medicaments and biological substances status: Secondary | ICD-10-CM | POA: Diagnosis not present

## 2018-12-13 DIAGNOSIS — R1031 Right lower quadrant pain: Secondary | ICD-10-CM | POA: Diagnosis not present

## 2018-12-13 DIAGNOSIS — Z20828 Contact with and (suspected) exposure to other viral communicable diseases: Secondary | ICD-10-CM | POA: Diagnosis not present

## 2018-12-13 DIAGNOSIS — R079 Chest pain, unspecified: Secondary | ICD-10-CM | POA: Diagnosis not present

## 2018-12-13 DIAGNOSIS — R5383 Other fatigue: Secondary | ICD-10-CM | POA: Diagnosis not present

## 2018-12-18 DIAGNOSIS — Z8742 Personal history of other diseases of the female genital tract: Secondary | ICD-10-CM | POA: Diagnosis not present

## 2018-12-18 DIAGNOSIS — D259 Leiomyoma of uterus, unspecified: Secondary | ICD-10-CM | POA: Diagnosis not present

## 2018-12-18 DIAGNOSIS — R102 Pelvic and perineal pain: Secondary | ICD-10-CM | POA: Diagnosis not present

## 2018-12-25 DIAGNOSIS — D252 Subserosal leiomyoma of uterus: Secondary | ICD-10-CM | POA: Diagnosis not present

## 2018-12-25 DIAGNOSIS — D251 Intramural leiomyoma of uterus: Secondary | ICD-10-CM | POA: Diagnosis not present

## 2018-12-25 DIAGNOSIS — D259 Leiomyoma of uterus, unspecified: Secondary | ICD-10-CM | POA: Diagnosis not present

## 2018-12-25 DIAGNOSIS — D250 Submucous leiomyoma of uterus: Secondary | ICD-10-CM | POA: Diagnosis not present

## 2019-01-08 DIAGNOSIS — R102 Pelvic and perineal pain: Secondary | ICD-10-CM | POA: Diagnosis not present

## 2019-01-08 DIAGNOSIS — D259 Leiomyoma of uterus, unspecified: Secondary | ICD-10-CM | POA: Diagnosis not present

## 2019-01-08 DIAGNOSIS — Z8742 Personal history of other diseases of the female genital tract: Secondary | ICD-10-CM | POA: Diagnosis not present

## 2019-08-21 ENCOUNTER — Inpatient Hospital Stay: Payer: 59 | Attending: Internal Medicine | Admitting: Rehabilitative and Restorative Service Providers"

## 2019-08-21 ENCOUNTER — Encounter: Payer: Self-pay | Admitting: Rehabilitative and Restorative Service Providers"

## 2019-08-21 DIAGNOSIS — M542 Cervicalgia: Secondary | ICD-10-CM | POA: Insufficient documentation
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
## 2019-08-23 DIAGNOSIS — M542 Cervicalgia: Secondary | ICD-10-CM | POA: Insufficient documentation

## 2019-08-26 ENCOUNTER — Inpatient Hospital Stay: Payer: 59 | Attending: Internal Medicine | Admitting: Rehabilitative and Restorative Service Providers"

## 2019-08-26 DIAGNOSIS — M542 Cervicalgia: Secondary | ICD-10-CM

## 2019-08-26 NOTE — PT/OT Therapy Note (Signed)
Name: Vanessa Coleman Age: 39 y.o.   Date of Service: 08/26/2019  Referring Physician: Mauro Kaufmann, MD   Date of Injury: 04/21/2019  Date Care Plan Established/Reviewed: 08/21/2019  Date Treatment Started: 08/21/2019  End of Certification Date: 10/19/2019  Sessions in Plan of Care: 16  Surgery Date: No data was found    Visit Count: 2   Diagnosis:   1. Cervicalgia        Subjective     Pain   Current pain rating: 9  Location: R>L neck  **Patient treatment delayed 10 minutes due to scheduling confusion. Patient reports the last two days she has been having a bad tension HA. Her sister massaged her neck and patient got some relief from that. Patient reports she took 600 mg of Ibuprofen this morning and has 9-10/10 pain currently in her neck R>L.    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   - WU UBE x6' with subjective taken to assist with today's session   - Manual LS/UT stretch 1x30 sec each  - Self stretch LS/UT/SCM 1x30 sec ea bilaterally  - HEP updated and reviewed with patient, handout provided    Neuromuscular Re-Education - Justified to address any of the following: of movement, balance, coordination, kinesthetic sense, posture and/or proprioception for sitting and/or standing activities.   - SA punches 2x10 in supine with cues to decrease shoulder hiking   - Scapular retraction in sitting 2x10 with cues to decrease UT activation and B UE extension  - Patient education in scapular setting prior to lifting objects  - Patient instructed to focus on scapular retraction to improve posture and rounded shoulders over cervical extension as patient reports pain and discomfort with cervical  extension    Manual Therapy - Justified to address any of the following:  Mobilization of joints and soft tissues, manipulation, manual lymphatic drainage, and/or manual traction.    - STM and TPR to upper trap, levator scapula, parascapular muscles, SCM, scalenes R>L  - Suboccipital release  - Cervical distraction  - C1 rotation assessment- rotated to the L  - MET C1 rotation correction      Therapeutic Activity - Justified to address the following: activities to improve functional performance.        Home Exercises   Access Code: [email or web address]  URL: [email or web address]  Date: 08/26/2019  Prepared by: Lendon Ka    Exercises  .Seated Cervical Sidebending Stretch - 1 x daily - 7 x weekly - 3 sets - 30 second hold  .Seated Levator Scapulae Stretch - 1 x daily - 7 x weekly - 3 sets - 30 second hold  .Sternocleidomastoid Stretch - 1 x daily - 7 x weekly - 3 sets - 30 second hold  .Seated Scapular Retraction - 2 x daily - 5 x weekly - 3 sets - 10 reps - 5 second hold  .Supine Single Arm Shoulder Protraction - 1 x daily - 5 x weekly - 3 sets - 10 reps            Initial Evaluation Reference and/or Current Measurements(as dated):   AROM: Cervical Spine    Initial  Flexion 32         Extension 25           R L R L   Rotation 42 62       Side Bending 28 30       (blank fields were intentionally left blank)        STRENGTH  Cervical  MMT /5 IE R  IE L  R  L    C1/2 Neck Flex  5 p!         Neck Ext  5 p!         C3 Neck Sidebend   5 p!  5       Neck Rotation   4 p!  4        (blank fields were intentionally left blank)      Initial R     R  UE Strength  MMT /5 Initial  L     L    5   Shoulder Flexion  5     5   Shoulder Abduction (C5)  5         Shoulder Extension         T1   Shoulder External Rotation   T1     T7   Shoulder Internal Rotation  T7         Rhomboids            Serratus           Upper Trapezius (C4)           Middle Trapezius (C4)            Lower Trapezius (C4)        5    Biceps (C6)   5       5-   Triceps (C7)   5-      5-   Wrist Extension (C7)   5-      5-   Wrist Flexion (C7)   5-     (blank fields were intentionally left blank)    Outcome Measure   Tool Used/Details: FOTO- thoracic spine  Score: 40  Predicted Functional Outcome: 56      Total Time   Timed Minutes  35 minutes   Untimed Minutes  0 minutes   Total Time  35 minutes           Assessment   Patient presents to OP PT with 9-10/10 pain and HA the last two days. She demonstrates a C1 rotation to the L that was corrected with MET. Patient demosntrated muscle spasm and trigger points in bilat UT, parascapular muscles, levator scapulas, scalenes and SCM with R>L that decreased with STM and TPR. Patient felt relief with manual therapy, and manual and self stretches. Patient was instructed in scapular retraction and protraction and instructed to set scapulas prior to lifting objects or her daughter. Patient was instructed to focus on scapular retraction to improve posture versus cervical extension as patient reported the cervical extension caused pain and discomfort. Patient would continue to benefit from skilled OP PT to address limitations in ROM, flexibility, strength, and neuromuscular control to help return to PLOF.  Plan   Continue with POC; continue with scapular training and postural training; STM/TPR to upper back and neck musculature; assess C1 rotation and cervical segment mobility; f/u on HEP       Goals    Goal 1: Patient will demonstrate independence in prescribed HEP with  proper form, sets and reps for safe discharge to an independent program.   Sessions: 16      Goal 2: Increase active cervical rotation to 60 degrees for increased visual field to allow safe changing lanes and backing up vehicle while driving.   Sessions: 16      Goal 3: Increase mid-trap/lower trap strength 5/5 for maintenance of proper standing posture needed to stand and walk for >1 hour.   Sessions: 16      Goal 4: Patient able to demonstrate correct body  mechanics and proper back position during sitting and lifting activities to allow return to household activities with pain <3/10.   Sessions: 16          Goal 5: Demonstrate proper posture and body mechanics with sleeping position to allow patient to sleep undisturbed > 6 hours.   Sessions: 16      Goal 6: Improve FOTO score to 56 points by visit 12 to improve patient's quality of life.   Sessions: 16                    Today's treatment session was completed under the direct 1:1 supervision of the Licensed Therapist. The Licensed Therapist was present and in the room for the entire session and was fully responsible for the therapy assessment and treatment interventions while the student participated/provided services being performed. Documentation has been reviewed and approved by the Licensed Therapist via co-signature.   Maryla Morrow, SPT  [facility] [place]. Delene Ruffini, PT, DPT [place] # [phone]

## 2019-08-28 ENCOUNTER — Encounter (HOSPITAL_BASED_OUTPATIENT_CLINIC_OR_DEPARTMENT_OTHER): Payer: Self-pay | Admitting: Neurology

## 2019-08-28 ENCOUNTER — Ambulatory Visit (INDEPENDENT_AMBULATORY_CARE_PROVIDER_SITE_OTHER): Payer: 59 | Admitting: Neurology

## 2019-08-28 VITALS — BP 177/77 | HR 80 | Resp 18 | Ht 66.0 in | Wt 153.0 lb

## 2019-08-28 DIAGNOSIS — I10 Essential (primary) hypertension: Secondary | ICD-10-CM

## 2019-08-28 DIAGNOSIS — M542 Cervicalgia: Secondary | ICD-10-CM

## 2019-08-28 DIAGNOSIS — R42 Dizziness and giddiness: Secondary | ICD-10-CM
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
## 2019-09-02 ENCOUNTER — Telehealth: Payer: Self-pay | Admitting: Rehabilitative and Restorative Service Providers"

## 2019-09-02 ENCOUNTER — Inpatient Hospital Stay: Payer: 59 | Admitting: Rehabilitative and Restorative Service Providers"

## 2019-09-09 ENCOUNTER — Inpatient Hospital Stay: Payer: 59 | Attending: Internal Medicine

## 2019-09-09 DIAGNOSIS — M542 Cervicalgia: Secondary | ICD-10-CM | POA: Insufficient documentation

## 2019-09-09 NOTE — PT/OT Therapy Note (Signed)
Name: Nalany Steedley Age: 39 y.o.   Date of Service: 09/09/2019  Referring Physician: Mauro Kaufmann, MD   Date of Injury: 04/21/2019  Date Care Plan Established/Reviewed: 08/21/2019  Date Treatment Started: 08/21/2019  End of Certification Date: 10/19/2019  Sessions in Plan of Care: 16  Surgery Date: No data was found    Visit Count: 3   Diagnosis:   1. Cervicalgia        Subjective     Pain   Current pain rating: 9  Location: R>L neck  Patient reports the massaging at last rx helped at lot & she felt more calm after. Today she is more aware of left UT area pain, per demo. Patient was busy with a lot of activity this weekend for her sister's wedding. Patient's pain level is rated 8/10 today; now & off. Previously pain was constant. Patient has not taken any pain medication today. Patient reports her headache is better. She is no longer having dizziness like she was previously. Patient has been using the tennis ball massage at home & finds helpful.     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   - WU UBE x5' with subjective obtained to assist with session planning  - Manual LS/UT & scalene stretch 2x30 sec each  - Self stretch LS/UT/SCM 1x30 sec ea bilaterally    -Added thoracic stretch seated with 1/2 roll x 10 5 sec, emphasis on neutral cervical position (Patient advised to avoid extension)    Neuromuscular Re-Education - Justified to address any of the following: of movement, balance, coordination, kinesthetic sense, posture and/or proprioception for sitting and/or standing activities.   - Patient reminded to set scaps prior to begin each activity:  - SA punches 2x10 in supine with  cues to decrease shoulder hiking (add weight NT)  - Rows YTB x 15 with tactile, visual & verbal cues for avoiding UT activation (09/09/19: progressed from Scapular retraction in sitting)  -Added B ER with emphasis on scap retraction without UT activation YTB x 15  -Initiated wall PUs x 15 without UT activation - verbal, visual & tactile cues used  -Added small ball alphabet on wall x 1 A-Z each B, tactile cues to depress scap     Manual Therapy - Justified to address any of the following:  Mobilization of joints and soft tissues, manipulation, manual lymphatic drainage, and/or manual traction.    - STM and TPR to bilateral upper trap, levator scapula, parascapular muscles, SCM, scalenes   - Suboccipital release  - Cervical distraction    Not completed 09/09/19:  - C1 rotation assessment- rotated to the L  - MET C1 rotation correction      Therapeutic Activity - Justified to address the following: activities to improve functional performance.  Patient education for postural awareness, neutral spine to avoid increased stress through neck / upper back:  Patient standing posture education - core activation to decrease lumbar lordosis & neutralize spinal position to decrease stress to mid & lower back (see assessment).    Educated patient to also be aware of tension in shoulders / neck -  if she is aware that there is increased tension in her shoulders / neck during ADLs to consciously depress scap & monitor tension.    -Sleeping position education: use of a towel / t-shirt roll in pillowcase to neutralize cervical position while sleeping on side or back. Demo with patient in supine position. Also discussed having someone at home look at her neck alignment while lying down to sleep to make sure she is in a neutral position. Patient stated feeling better with support under neck.     Home Exercises   Access Code: [email or web address]  URL: [email or web address]  Date: 08/26/2019  Prepared by: Lendon Ka    Exercises  .Seated  Cervical Sidebending Stretch - 1 x daily - 7 x weekly - 3 sets - 30 second hold  .Seated Levator Scapulae Stretch - 1 x daily - 7 x weekly - 3 sets - 30 second hold  .Sternocleidomastoid Stretch - 1 x daily - 7 x weekly - 3 sets - 30 second hold  .Seated Scapular Retraction - 2 x daily - 5 x weekly - 3 sets - 10 reps - 5 second hold  .Supine Single Arm Shoulder Protraction - 1 x daily - 5 x weekly - 3 sets - 10 reps           ---      ---   Total Time   Timed Minutes  50 minutes   Total Time  50 minutes       Initial Evaluation Reference and/or Current Measurements(as dated):   AROM: Cervical Spine    Initial         Flexion 32         Extension 25           R L R L   Rotation 42 62       Side Bending 28 30       (blank fields were intentionally left blank)        STRENGTH  Cervical  MMT /5 IE R  IE L  R  L    C1/2 Neck Flex  5 p!         Neck Ext  5 p!         C3 Neck Sidebend   5 p!  5       Neck Rotation   4 p!  4        (blank fields were intentionally left blank)      Initial R     R  UE Strength  MMT /5 Initial  L     L    5   Shoulder Flexion  5     5   Shoulder Abduction (C5)  5         Shoulder Extension         T1   Shoulder External Rotation   T1     T7   Shoulder Internal Rotation  T7         Rhomboids            Serratus           Upper Trapezius (C4)           Middle Trapezius (C4)            Lower Trapezius (C4)        5    Biceps (C6)   5      5-   Triceps (C7)   5-  5-   Wrist Extension (C7)   5-      5-   Wrist Flexion (C7)   5-     (blank fields were intentionally left blank)    Outcome Measure   Tool Used/Details: FOTO- thoracic spine  Score: 40  Predicted Functional Outcome: 56      Total Time   Timed Minutes  35 minutes   Untimed Minutes  0 minutes   Total Time  35 minutes           Assessment   Patient noted feeling "something pop" in her left shoulder during Rows exercise / NMR, but no verbalized pain. Patient has tendency to stand with increased lordotic curvature, able to mostly correct  with tactile, verbal & visual cues; once patient activated core immediate decrease in lordotic curve / stress to back. Patient demo increased muscular tension to right cervical PVM, noted during MT. Patient's suboccipital tension less then tension noted to PVM, although patient was still tender along suboccipitals.   Plan   Continue with POC; continue with scapular / postural awareness & retraining; Assess C1 rotation and cervical segment mobility.       Goals    Goal 1: Patient will demonstrate independence in prescribed HEP with proper form, sets and reps for safe discharge to an independent program.   Sessions: 16      Goal 2: Increase active cervical rotation to 60 degrees for increased visual field to allow safe changing lanes and backing up vehicle while driving.   Sessions: 16      Goal 3: Increase mid-trap/lower trap strength 5/5 for maintenance of proper standing posture needed to stand and walk for >1 hour.   Sessions: 16      Goal 4: Patient able to demonstrate correct body mechanics and proper back position during sitting and lifting activities to allow return to household activities with pain <3/10.   Sessions: 16          Goal 5: Demonstrate proper posture and body mechanics with sleeping position to allow patient to sleep undisturbed > 6 hours.   Sessions: 16      Goal 6: Improve FOTO score to 56 points by visit 12 to improve patient's quality of life.   Sessions: [address] [place], [place]

## 2019-09-11 ENCOUNTER — Inpatient Hospital Stay: Payer: 59 | Attending: Internal Medicine

## 2019-09-11 DIAGNOSIS — M542 Cervicalgia: Secondary | ICD-10-CM

## 2019-09-11 NOTE — PT/OT Therapy Note (Signed)
Name: Vanessa Coleman Age: 39 y.o.   Date of Service: 09/11/2019  Referring Physician: Mauro Kaufmann, MD   Date of Injury: 04/21/2019  Date Care Plan Established/Reviewed: 08/21/2019  Date Treatment Started: 08/21/2019  End of Certification Date: 10/19/2019  Sessions in Plan of Care: 16  Surgery Date: No data was found    Visit Count: 4   Diagnosis:   1. Cervicalgia        Subjective     Pain   Location: R>L neck  Patient reports she was able to go without pain medication yesterday, but today she woke up with increased right shoulder pain (patient demo down to lateral deltoid area). Patient states the pain level is not an emergency, but is increased generally. She feels her increased pain level may also be related to vacuuming today, which her husband has been doing recently. Patient states she went for a walk for about an hour on Monday evening after her PT treatment & was aware of increased soreness to T1 area (per demo) when she was finished. Patient tried use of towel roll to help support neck position while sleeping & did find helpful.    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   - WU UBE x5' with subjective obtained to assist with session planning  -Reassessed cervical AROM (see grids above)    - Manual LS/UT & scalene stretch 2x30 sec each  - Self stretch LS/UT 2x30 sec ea bilaterally, patient seated    -Thoracic stretch seated with 1/2 roll x 10 5 sec, emphasis on neutral cervical position (Patient advised to avoid extension)    Neuromuscular Re-Education - Justified to address any of the following: of movement, balance, coordination, kinesthetic sense, posture and/or  proprioception for sitting and/or standing activities.   - Patient reminded to set scaps prior to begin each activity:  - SA punches 2x10 in supine with cues to decrease shoulder hiking - added 2# weights B  - Rows YTB x 15 with tactile, visual & verbal cues for avoiding UT activation (09/09/19: progressed from Scapular retraction in sitting)  -B ER with emphasis on scap retraction without UT activation YTB x 15  -Wall PUs x 15 without UT activation - verbal, visual & tactile cues used  -Small ball alphabet on wall x 1 A-Z each B, tactile cues to depress scap     Manual Therapy - Justified to address any of the following:  Mobilization of joints and soft tissues, manipulation, manual lymphatic drainage, and/or manual traction.    - STM and TPR to bilateral upper trap, levator scapula, parascapular muscles, SCM, scalenes   - Suboccipital release  - Cervical distraction  - C1 rotation assessment-no noted rotation      Therapeutic Activity - Justified to address the following: activities to improve functional performance.  Continued patient education for postural awareness, neutral spine to avoid increased stress through neck / upper back:  Patient standing posture education - core activation to decrease lumbar lordosis & neutralize spinal position to decrease stress to mid & lower back. Emphasized the need to consciously be aware of when  tension in shoulders is occurring so she can depress scapula to decrease muscle tension / tightness & discomfort during ADLs.    Not completed 09/11/19:  -Sleeping position education: use of a towel / t-shirt roll in pillowcase to neutralize cervical position while sleeping on side or back. Demo with patient in supine position. Also discussed having someone at home look at her neck alignment while lying down to sleep to make sure she is in a neutral position. Patient stated feeling better with support under neck.     Home Exercises   Access Code: [email or web address]  URL:  [email or web address]  Date: 08/26/2019  Prepared by: Lendon Ka    Exercises  .Seated Cervical Sidebending Stretch - 1 x daily - 7 x weekly - 3 sets - 30 second hold  .Seated Levator Scapulae Stretch - 1 x daily - 7 x weekly - 3 sets - 30 second hold  .Sternocleidomastoid Stretch - 1 x daily - 7 x weekly - 3 sets - 30 second hold  .Seated Scapular Retraction - 2 x daily - 5 x weekly - 3 sets - 10 reps - 5 second hold  .Supine Single Arm Shoulder Protraction - 1 x daily - 5 x weekly - 3 sets - 10 reps           ---      ---   Total Time   Timed Minutes  40 minutes   Total Time  40 minutes       Initial Evaluation Reference and/or Current Measurements(as dated):   AROM: Cervical Spine    Initial    09/11/19     Flexion 32    43*     Extension 25    40       R L R L   Rotation 42 62  61 * 60    Side Bending 28 30  35 31    (blank fields were intentionally left blank)  09/11/19: * Patient notes         STRENGTH  Cervical  MMT /5 IE R  IE L  R  L    C1/2 Neck Flex  5 p!         Neck Ext  5 p!         C3 Neck Sidebend   5 p!  5       Neck Rotation   4 p!  4        (blank fields were intentionally left blank)      Initial R     R  UE Strength  MMT /5 Initial  L     L    5   Shoulder Flexion  5     5   Shoulder Abduction (C5)  5         Shoulder Extension         T1   Shoulder External Rotation   T1     T7   Shoulder Internal Rotation  T7         Rhomboids            Serratus           Upper Trapezius (C4)           Middle Trapezius (C4)            Lower Trapezius (C4)        5    Biceps (C6)   5      5-  Triceps (C7)   5-      5-   Wrist Extension (C7)   5-      5-   Wrist Flexion (C7)   5-     (blank fields were intentionally left blank)    Outcome Measure   Tool Used/Details: FOTO- thoracic spine  Score: 40  Predicted Functional Outcome: 56      Assessment   Patient needed verbal, tactile & visual cues to help with postural awareness & work on consciously depressing scapula for improved posture, as well as  cues to decrease lordotic curve while standing. Patient noted some soreness in right shoulder toward last 5 reps of SA punches, but was able to complete 20 reps. Noted increased SCM tension during MT, but no C1 rotation. Patient noted feeling best with work to UT area during [place]. Overall patient stated feeling better after rx. Patient's cervical AROM increased in most planes since eval.  Plan   Continue with POC; continue with scapular / postural awareness & retraining; continue to monitor for C1 rotation and cervical segment mobility.       Goals    Goal 1: Patient will demonstrate independence in prescribed HEP with proper form, sets and reps for safe discharge to an independent program.    09/11/19: Patient states compliance with HEP.   Sessions: 16      Goal 2: Increase active cervical rotation to 60 degrees for increased visual field to allow safe changing lanes and backing up vehicle while driving.    9/52/84: Patient's cervical rotation AROM 60 degrees or more bilaterally. Patient states she still has to rotate her trunk to see over shoulder when she is changing lanes while driving. MM   Sessions: 16      Goal 3: Increase mid-trap/lower trap strength 5/5 for maintenance of proper standing posture needed to stand and walk for >1 hour.   Sessions: 16      Goal 4: Patient able to demonstrate correct body mechanics and proper back position during sitting and lifting activities to allow return to household activities with pain <3/10.   Sessions: 16          Goal 5: Demonstrate proper posture and body mechanics with sleeping position to allow patient to sleep undisturbed > 6 hours.   Sessions: 16      Goal 6: Improve FOTO score to 56 points by visit 12 to improve patient's quality of life.   Sessions: 16                    Maryla Morrow, SPT provided assistance during patient treatment, PTA present during full treatment session.     Madaline Brilliant, LPTA

## 2019-09-18 ENCOUNTER — Inpatient Hospital Stay: Payer: 59 | Attending: Internal Medicine

## 2019-09-18 DIAGNOSIS — M542 Cervicalgia: Secondary | ICD-10-CM | POA: Insufficient documentation

## 2019-09-18 NOTE — PT/OT Therapy Note (Signed)
Name: Vanessa Coleman Age: 39 y.o.   Date of Service: 09/18/2019  Referring Physician: Mauro Kaufmann, MD   Date of Injury: 04/21/2019  Date Care Plan Established/Reviewed: 08/21/2019  Date Treatment Started: 08/21/2019  End of Certification Date: 10/19/2019  Sessions in Plan of Care: 16  Surgery Date: No data was found    Visit Count: 5   Diagnosis:   1. Cervicalgia        Subjective     Pain   Current pain rating: 8  My neck is tender, mostly at B UTs.  Pain level 8/10.  I am feeling better with PT, but I do get sore the next day.  I am turning my body when having to turn my head to drive.  HAs are getting less now.    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         Neck Ext  5 p!         C3 Neck Sidebend   5 p!  5       Neck Rotation   4 p!  4        (blank fields were intentionally left blank)      Initial R     R  UE Strength  MMT /5 Initial  L     L    5   Shoulder Flexion  5     5   Shoulder Abduction (C5)  5         Shoulder Extension         T1   Shoulder External Rotation   T1     T7   Shoulder Internal Rotation  T7         Rhomboids            Serratus           Upper Trapezius (C4)           Middle Trapezius (C4)            Lower Trapezius (C4)        5    Biceps (C6)   5      5-   Triceps (C7)   5-      5-   Wrist Extension (C7)   5-      5-   Wrist Flexion (C7)   5-     (blank fields were intentionally left blank)     Outcome Measure   Tool  Used/Details: FOTO- thoracic spine  Score: 40  Predicted Functional Outcome: 56  Treatment     Therapeutic Exercises - Justified to address any of the following: develop strength, endurance, ROM and/or flexibility.   - Not available WU UBE x5' with subjective obtained to assist with session planning  - Manual UT and rotation stretching supine   -Thoracic stretch seated with 1/2 roll x 10 5 sec, emphasis on neutral cervical position (Patient advised to avoid extension)    No time:  - Self stretch LS/UT 2x30 sec ea bilaterally, patient seated      Neuromuscular Re-Education - Justified to address any of the following: of movement, balance, coordination, kinesthetic sense, posture and/or proprioception for sitting and/or standing activities.   -Added B horizontal abduction x 10 with YTB  - SA punches 2x10 in supine with cues to decrease shoulder hiking - 2# weights B  - Rows seated YTB x 15 with tactile, visual & verbal cues for avoiding UT activation  -B ER with emphasis on scap retraction without UT activation YTB x 15    No time:  -Wall PUs x 15 without UT activation - verbal, visual & tactile cues used  -Small ball alphabet on wall x 1 A-Z each B, tactile cues to depress scap     Manual Therapy - Justified to address any of the following:  Mobilization of joints and soft tissues, manipulation, manual lymphatic drainage, and/or manual traction.    - STM and TPR to bilateral upper trap, levator scapula,and  parascapular muscles  - Cervical distraction        Home Exercises   Access Code: [email or web address]  URL: [email or web address]  Date: 08/26/2019  Prepared by: Lendon Ka    Exercises  .Seated Cervical Sidebending Stretch - 1 x daily - 7 x weekly - 3 sets - 30 second hold  .Seated Levator Scapulae Stretch - 1 x daily - 7 x weekly - 3 sets - 30 second hold  .Sternocleidomastoid Stretch - 1 x daily - 7 x weekly - 3 sets - 30 second hold  .Seated Scapular Retraction - 2 x daily - 5 x weekly - 3  sets - 10 reps - 5 second hold  .Supine Single Arm Shoulder Protraction - 1 x daily - 5 x weekly - 3 sets - 10 reps           ---      ---   Total Time   Timed Minutes  37 minutes   Total Time  37 minutes        Assessment   Patient had greater tightness and tenderness at her R UT with STM.  Progressed with horizontal abduction using YTB with some fatigue and some shoulder elevation.  This lessened with verbal cueing.  Most UE strengthening seemed fatiguing. She did have less pain at the end of treatment, but was not rated.  Plan   Continue with plan of care.  Have patient do FOTO ( no time today).  Resume LS and scalene stretching.  Check 1st rib for any elevation B.      Goals    Goal 1: Patient will demonstrate independence in prescribed HEP with proper form, sets and reps for safe discharge to an independent program.    09/11/19: Patient states compliance with HEP.   Sessions: 16      Goal 2: Increase active cervical rotation to 60 degrees for increased visual field to allow safe changing lanes and backing up vehicle while driving.    5/40/98: Patient's cervical rotation AROM 60 degrees or more bilaterally. Patient  states she still has to rotate her trunk to see over shoulder when she is changing lanes while driving. MM   Sessions: 16      Goal 3: Increase mid-trap/lower trap strength 5/5 for maintenance of proper standing posture needed to stand and walk for >1 hour.   Sessions: 16      Goal 4: Patient able to demonstrate correct body mechanics and proper back position during sitting and lifting activities to allow return to household activities with pain <3/10.   Sessions: 16          Goal 5: Demonstrate proper posture and body mechanics with sleeping position to allow patient to sleep undisturbed > 6 hours.   Sessions: 16      Goal 6: Improve FOTO score to 56 points by visit 12 to improve patient's quality of life.   Sessions: [address]                      Penelope Galas, [place]

## 2019-09-23 ENCOUNTER — Inpatient Hospital Stay: Payer: 59 | Attending: Internal Medicine

## 2019-09-23 DIAGNOSIS — M542 Cervicalgia: Secondary | ICD-10-CM | POA: Insufficient documentation

## 2019-09-23 NOTE — PT/OT Therapy Note (Signed)
Name: Vanessa Coleman Age: 39 y.o.   Date of Service: 09/23/2019  Referring Physician: Mauro Kaufmann, MD   Date of Injury: 04/21/2019  Date Care Plan Established/Reviewed: 08/21/2019  Date Treatment Started: 08/21/2019  End of Certification Date: 10/19/2019  Sessions in Plan of Care: 16  Surgery Date: No data was found    Visit Count: 6   Diagnosis:   1. Cervicalgia        Subjective     Pain   Current pain rating: 8  Patient reports they have been moving from one apartment to another. She has not been doing a lot of lifting, they do have a company helping them, but just the activities needed for moving have increased her pain level. She denies any headache or dizziness recently, but feels the tension / tightness in her lower neck / upper thoracic area (per demo). She states wishing someone could just pull on her head. Patient continues to wake about 2 times/ night secondary to stiffness, stretches & returns to sleep.     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         Neck Ext  5 p!         C3 Neck Sidebend   5 p!  5       Neck Rotation   4 p!  4        (blank fields were intentionally left blank)      Initial R     R  UE Strength  MMT /5 Initial  L     L    5   Shoulder Flexion  5     5   Shoulder Abduction (C5)  5         Shoulder Extension         T1   Shoulder External Rotation   T1     T7   Shoulder Internal Rotation  T7         Rhomboids             Serratus           Upper Trapezius (C4)           Middle Trapezius (C4)            Lower Trapezius (C4)        5    Biceps (C6)   5      5-   Triceps (C7)   5-      5-   Wrist Extension (C7)   5-      5-   Wrist Flexion (C7)   5-     (  blank fields were intentionally left blank)     Outcome Measure   Tool Used/Details: FOTO- thoracic spine  Score: 40  Predicted Functional Outcome: 56  09/23/19: FOTO score 34                         Treatment     Therapeutic Exercises - Justified to address any of the following: develop strength, endurance, ROM and/or flexibility.   - WU UBE x5' with subjective obtained to assist with session planning  - Manual UT, LS & scalene stretching supine   -Thoracic stretch seated with 1/2 roll x 10 5 sec, emphasis on neutral cervical position, trial of cervical extension to tolerance    09/23/19 Not completed secondary to time:  - Self stretch LS/UT 2x30 sec ea bilaterally, patient seated      Neuromuscular Re-Education - Justified to address any of the following: of movement, balance, coordination, kinesthetic sense, posture and/or proprioception for sitting and/or standing activities.   - SA punches 2x10 in supine with cues to decrease shoulder hiking - 2# weights B  - Rows seated YTB x 15 with tactile, visual & verbal cues for avoiding UT activation & keep low back in a more neutral alignment   -B ER with emphasis on scap retraction without UT activation YTB x 15  -Wall PUs x 15 without UT activation - verbal, visual & tactile cues used for proper low back alignment & avoiding UT activation   -Small ball alphabet on wall x 1 A-Z each B, tactile cues to depress scap     09/23/19 Not completed secondary to time:  -Added B horizontal abduction x 10 with YTB    Manual Therapy - Justified to address any of the following:  Mobilization of joints and soft tissues, manipulation, manual lymphatic drainage, and/or manual traction.    - STM and TPR to bilateral cervical PVM,  upper trap, levator  scapula, scalene, SCM and  parascapular muscles  - Cervical distraction        Home Exercises   Access Code: [email or web address]  URL: [email or web address]  Date: 08/26/2019  Prepared by: Lendon Ka    Exercises  .Seated Cervical Sidebending Stretch - 1 x daily - 7 x weekly - 3 sets - 30 second hold  .Seated Levator Scapulae Stretch - 1 x daily - 7 x weekly - 3 sets - 30 second hold  .Sternocleidomastoid Stretch - 1 x daily - 7 x weekly - 3 sets - 30 second hold  .Seated Scapular Retraction - 2 x daily - 5 x weekly - 3 sets - 10 reps - 5 second hold  .Supine Single Arm Shoulder Protraction - 1 x daily - 5 x weekly - 3 sets - 10 reps           ---      ---   Total Time   Timed Minutes  47 minutes   Total Time  47 minutes        Assessment   Patient able to complete there-ex & NMR, but did verbalize some challenge & fatigue. Bilateral scapula continue to come away from rib age during activities in front of body, noted during NMR / there-ex. Patient demo increased bilateral UT & LS tension, noted during MT. Patient appeared most tender to left LS with MT today, as well as right SCM & scalenes. Patient's FOTO score decreased since eval, but was not able to verbally review with patient, plan to address NT.  Plan   Continue with plan of care with emphasis on scapular stabilization & postural strengthening. Discuss FOTO score results with patient NT secondary to decrease in subjective functional score. Consider reassessment of strength NT.       Goals    Goal 1: Patient will demonstrate independence in prescribed HEP with proper form, sets and reps for safe discharge to an independent program.    09/11/19: Patient states compliance with HEP.   Sessions: 16      Goal 2: Increase active cervical rotation to 60 degrees for increased visual field to allow safe changing lanes and backing up vehicle while driving.    1/61/09: Patient's cervical rotation AROM 60 degrees or more bilaterally. Patient states she still has to rotate  her trunk to see over shoulder when she is changing lanes while driving. MM   Sessions: 16      Goal 3: Increase mid-trap/lower trap strength 5/5 for maintenance of proper standing posture needed to stand and walk for >1 hour.   Sessions: 16      Goal 4: Patient able to demonstrate correct body mechanics and proper back position during sitting and lifting activities to allow return to household activities with pain <3/10.   Sessions: 16          Goal 5: Demonstrate proper posture and body mechanics with sleeping position to allow patient to sleep undisturbed > 6 hours.    09/23/19: Patient waking about 2 times per night secondary to tightness / stiffness, stretching ^ returning to sleep. MM   Sessions: 16      Goal 6: Improve FOTO score to 56 points by visit 12 to improve patient's quality of life.       Sessions: [id number] [place], [place]

## 2019-09-25 ENCOUNTER — Inpatient Hospital Stay: Payer: 59 | Attending: Internal Medicine | Admitting: Rehabilitative and Restorative Service Providers"

## 2019-09-25 DIAGNOSIS — M542 Cervicalgia: Secondary | ICD-10-CM | POA: Insufficient documentation

## 2019-09-25 NOTE — PT/OT Therapy Note (Signed)
Name: Vanessa Coleman Age: 39 y.o.   Date of Service: 09/25/2019  Referring Physician: Mauro Kaufmann, MD   Date of Injury: 04/21/2019  Date Care Plan Established/Reviewed: 08/21/2019  Date Treatment Started: 08/21/2019  End of Certification Date: 10/19/2019  Sessions in Plan of Care: 16  Surgery Date: No data was found    Visit Count: 7   Diagnosis:   1. Cervicalgia        Subjective     Pain   Current pain rating: 8  Location: CT junction paraspinals  Patient states that she hasn't had any change in her pain since last week.  She reports continued tightness/stiffness at the base of her neck.  She states that she still has been moving/lifting a lot with moving into a new apartment.  She states that last night she notices that the pain on the back of her head came back, which has been gone since starting PT.  Patient states that she feels like she has improved since [facility].  Her pain is better so she can do more activities at home in the kitchen or with her daughter because she doesn't have to lay down as much.  She states that she can carry her purse on R shoulder now for short periods without pain.  She has less difficulty with lifting/carrying groceries and declines difficulty with opening jars or watching TV.    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   - WU UBE x6' with subjective obtained to assist with session planning  - Self B UT/LS stretches 30" ea  -Added CT junction stretch 2 x 30"        Neuromuscular Re-Education - Justified to address any of the following: of movement, balance, coordination, kinesthetic sense, posture and/or proprioception for sitting and/or standing activities.   Added  standing with 1/2 foam roller vertical up back; towel roll behind head for activities below:  -cervical and scapular retraction 10x10"  -B shoulder ER YTB 2 x 10, 3" hold  -B shoulder horz abd YTB 2 x 10, 3" hold    NOT TODAY:  - SA punches 2x10 in supine with cues to decrease shoulder hiking - 2# weights B  - Rows seated YTB x 15 with tactile, visual & verbal cues for avoiding UT activation & keep low back in a more neutral alignment   -B ER with emphasis on scap retraction without UT activation YTB x 15  -Wall PUs x 15 without UT activation - verbal, visual & tactile cues used for proper low back alignment & avoiding UT activation   -Small ball alphabet on wall x 1 A-Z each B, tactile cues to depress scap   -Added B horizontal abduction x 10 with YTB    Manual Therapy - Justified to address any of the following:  Mobilization of joints and soft tissues, manipulation, manual lymphatic drainage, and/or manual traction.    -Prone Grade III PA MOBS to R T1-T2 TP  -Grade III PA MOBS to T1-T5 vertebra  -Supine manual cervical traction in 20 degs flexion to target lower cervical spine        Therapeutic Activity -  Justified to address the following: activities to improve functional performance.  Reviewed FOTO with patient to assess decline in score.  Patient's subjective reports do not reflect decline in functional abilities on FOTO.  Patient reports difficulty with lifting a basket of laundry from the floor; limited with walking more than 30 minutes    Home Exercises   Access Code: [email or web address]  URL: [email or web address]  Date: 08/26/2019  Prepared by: Lendon Ka    Exercises  .Seated Cervical Sidebending Stretch - 1 x daily - 7 x weekly - 3 sets - 30 second hold  .Seated Levator Scapulae Stretch - 1 x daily - 7 x weekly - 3 sets - 30 second hold  .Sternocleidomastoid Stretch - 1 x daily - 7 x weekly - 3 sets - 30 second hold  .Seated Scapular Retraction - 2 x daily - 5 x weekly - 3 sets - 10 reps - 5 second  hold  .Supine Single Arm Shoulder Protraction - 1 x daily - 5 x weekly - 3 sets - 10 reps           ---      ---   Total Time   Timed Minutes  40 minutes   Total Time  40 minutes        Assessment   Patient reports functional improvements with PT despite decline in FOTO score.  She will benefit from further scapular stabilization and education on proper lifting/carrying techniques to improve her tolerance for household chores.  She has continued forward head/rounded shoulder posture which increases CT strain.  She has neutral cervical vertebra alignment.  She struggles with posture with 1/2 foam roller behind back but does not report increased pain.  She has better posture at end of session today with increased retraction.  She is encouraged to focus on proper posture to maintain mobility in CT junction.  Plan   Continue with POC: Initiate prone cervical retractions, prone T/Y; measure strength; initiate training for proper lifting box from floor and carry to table      Goals    Goal 1: Patient will demonstrate independence in prescribed HEP with proper form, sets and reps for safe discharge to an independent program.    09/11/19: Patient states compliance with HEP.   Sessions: 16      Goal 2: Increase active cervical rotation to 60 degrees for increased visual field to allow safe changing lanes and backing up vehicle while driving.    5/40/98: Patient's cervical rotation AROM 60 degrees or more bilaterally. Patient states she still has to rotate her trunk to see over shoulder when she is changing lanes while driving. MM   Sessions: 16      Goal 3: Increase mid-trap/lower trap strength 5/5 for maintenance of proper standing posture needed to stand and walk for >1 hour.   Sessions: 16      Goal 4: Patient able to demonstrate correct body mechanics and proper back position during sitting and lifting activities to allow return to household activities with pain <3/10.   Sessions: 16          Goal 5: Demonstrate proper  posture and body mechanics with sleeping position to allow patient to sleep undisturbed > 6 hours.    09/23/19: Patient waking about 2 times per night secondary to tightness / stiffness, stretching ^ returning to sleep. MM   Sessions: 16      Goal 6: Improve FOTO score to 56 points by visit 12 to improve patient's quality  of life.       Sessions: [address]                      Quillian Quince, PT

## 2019-09-30 ENCOUNTER — Inpatient Hospital Stay: Payer: 59

## 2019-09-30 ENCOUNTER — Telehealth: Payer: Self-pay

## 2019-09-30 NOTE — Telephone Encounter (Signed)
Called pt and left voice msg letting her know that since today is her second no show she is now placed on same day scheduling and that we are not able to schedule any future apts at this time, to call us on the day that she is able to come in and if we have openings we can place her on the schedule. JE

## 2019-09-30 NOTE — Telephone Encounter (Signed)
Pt returned my phone call she stated that she did not cancel today's apt because she was waiting on her ride and her ride was her husband whom made it home too late to bring her and she did not wanted to cancel the apt because she thought she could still come in. I explained same day scheduling procedures to her and she agreed that it will work for her because she will call us to ask if we have apts when she has a ride. JE

## 2019-10-02 ENCOUNTER — Encounter (FREE_STANDING_LABORATORY_FACILITY): Payer: 59

## 2019-10-02 ENCOUNTER — Ambulatory Visit: Payer: Self-pay

## 2019-10-02 DIAGNOSIS — J3489 Other specified disorders of nose and nasal sinuses: Secondary | ICD-10-CM

## 2019-10-02 DIAGNOSIS — J329 Chronic sinusitis, unspecified: Secondary | ICD-10-CM

## 2019-10-02 LAB — SURGICAL PATHOLOGY EXAM

## 2019-10-03 ENCOUNTER — Inpatient Hospital Stay: Payer: 59 | Admitting: Rehabilitative and Restorative Service Providers"

## 2019-10-04 LAB — LAB USE ONLY - HISTORICAL SURGICAL PATHOLOGY

## 2019-11-28 ENCOUNTER — Inpatient Hospital Stay: Payer: 59 | Attending: Internal Medicine | Admitting: Rehabilitative and Restorative Service Providers"

## 2019-11-28 ENCOUNTER — Encounter: Payer: Self-pay | Admitting: Rehabilitative and Restorative Service Providers"

## 2019-11-28 VITALS — BP 126/94 | HR 65

## 2019-11-28 DIAGNOSIS — M542 Cervicalgia: Secondary | ICD-10-CM | POA: Insufficient documentation

## 2019-11-28 NOTE — Progress Notes (Signed)
Name:Vanessa Coleman Age: 39 y.o.   Date of Service: 11/28/2019  Referring Physician: Mauro Kaufmann, MD   Date of Injury: 10/02/2019  Date Care Plan Established/Reviewed: 11/28/2019  Date Treatment Started: 11/28/2019  End of Certification Date: 02/25/2020  Sessions in Plan of Care: 12  Surgery Date: No data was found      Visit Count: 1   Diagnosis:   1. Cervicalgia      Insurance limits to 30 visits.  Has used 7 currently.     Subjective     History of Present Illness   Mechanism of injury: treated here in PT for neck/upper back pain July 21-Aug 25, and was progressing well with POC  History of Present Illness: Nose/Sinus surgery 1 Sept 21.  Had to keep head up and in extension, and difficulty with sleeping, which increased neck pain.  Also, increased HA.  Nose/sinus surgery has healed well now, and ready to work on neck again.    Reports also has issue with alignment, which impacts the way I walk.  R hip pain.    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  Functional Limitations (PLOF): With driving, hard to turn to look over shoulder, R > L ( no issues with driving); with increased activity and movement, increased incidence of HA that last for few hours and take medicine to control (HA have changed from front sinus to back of head now); unable to carry 2 gallons of water in one hand (no issues with carrying gallons of water); increased neck and upper back pain with walking for 45 min (able to walk for an hour for exercise without issue);      Outcome Measure   Tool Used/Details: FOTO (11)  Score: 48  Predicted Functional Outcome: 59    Pain   Current pain rating: 9  At best pain rating: 3  At worst pain rating: 10  Location: neck, R > L shoulder    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  Anemia    . Arthritis    . Depression    . Headache        Objective     Observation  Well nourished , pleasant female; sits with guarded posture    Posture     Head  Forward.    Shoulders  Rounded.    Integumentary   no wound, lesion or rash noted    Strength     Left Hand     Grip (2nd hand position)     Trial 1: 50    Trial 2: 38    Trial 3: 40    Average: 42.67    Right Hand     Grip (2nd hand position)     Trial 1: 45    Trial 2: 50    Trial 3: 45    Average: 46.67    Palpation Very TTP R > L UT, C/S PVM    Neurological Testing     Sensation   Cervical/Thoracic   Left   Intact: light touch    Right   Intact: light touch      BP: (!) 126/94 Heart Rate: 65    Treatment     Therapeutic Exercises - Justified to address any  of the following: develop strength, endurance, ROM and/or flexibility.   HEP instruction and review    Manual Therapy - Justified to address any of the following:  Mobilization of joints and soft tissues, manipulation, manual lymphatic drainage, and/or manual traction.    -SOR  -STM/DTM B UT, C/S PVM    Therapeutic Activity - Justified to address the following: activities to improve functional performance.  -educated in posture support bra to improve posture, reduce neck and shoulder strain.  Reviewed examples on computer.      Home Exercises   Access Code: [email or web address]  URL: [email or web address]  Date: 11/28/2019  Prepared by: Raynelle Fanning Rimel    Exercises  Seated Passive Cervical Retraction - 5 x daily - 5 x weekly - 1 sets - 5 reps - 5 hold  Seated Scapular Retraction - 5 x daily - 5 x weekly - 1 sets - 5 reps - 5 hold  Sidelying Open Book Thoracic Lumbar Rotation and Extension - 2 x daily - 5 x weekly - 1 sets - 10 reps - 10 hold  Doorway Pec Stretch at 90 Degrees Abduction - 2 x daily - 5 x weekly - 1 sets - 3 reps - 30 hold         AROM: Cervical Spine   Initial      Flexion 40      Extension 31       R L R L   Rotation 65 54     Side Bending 25 26     (blank fields were intentionally  left blank)    Initial R   R UE Strength  MMT /5 Initial  L   L   3+  Shoulder Flexion 3+    4  Shoulder Abduction 4    4+  Shoulder Extension 4+    4  Shoulder External Rotation 4    4+  Shoulder Internal Rotation 4+    4+  Biceps 4+    5  Triceps 5    (blank fields were intentionally left blank)       ---      ---   Total Time    Timed Minutes 28 minutes   Untimed Minutes 20 minutes   Total Time 48 minutes        Assessment   Vanessa Coleman is a 39 y.o. female presenting with Cervicalgia with exacerbation after nose/sinus surgery Sept 1, 2021 who requires Physical Therapy for the following:  Impairments:   IPTC Impairments: "Pain that limits and interferes with functional ability","Decreased range of motion", "Decreased strength", "Decreased joint mobility"    Pain located: Cervical Spine, B Shld    Clinical presentation: stable - predictable recovery pattern  Barriers to therapy: Time since onset of injury/illness/exacerbation - increased biomechanical compensation over time  Past surgical history - past surgery to this area which exacerbated symptoms       Functional Limitations (PLOF): With driving, hard to turn to look over shoulder, R > L ( no issues with driving); with increased activity and movement, increased incidence of HA that last for few hours and take medicine to control (HA have changed from front sinus to back of head now); unable to carry 2 gallons of water in one hand (no issues with carrying gallons of water); increased neck and upper back pain with walking for 45 min (able to walk for an hour for exercise without issue);    Prognosis: good  Plan   Visits per week: 2  Number of Sessions: 12  Direct One on One  16109: Therapeutic Exercise: To Develop Strength and Endurance, ROM and Flexibility  [phone]: Neuromuscular Reeducation (Proprioceptive Neuromuscular Faciliation)  97140: Manual Therapy techniques (mobilization, manipulation, manual traction) (Grade I-V to cervical spine, thoracic spine, shoulder  girdle, and regionally interdependent joints, soft tissue mobilization, instrument assisted soft tissue mobilization.)  97530: Therapeutic Activities: Dynamic activities to improve functional performance  Dry Needling  Supervised Modalities  97010: Thermal modalities: hot/cold packs  09811: Mechnical traction  97014: Electrical stimulation    Plan for next session: review HEP, COMBO Bike, STM/DTM UT/CS PVM, Open Book, B ER, H Abd, scapular strengthening, postural awareness      Goals    Goal 1: Patient will demonstrate independence in prescribed HEP with proper form, sets and reps for safe discharge to an independent program.   Sessions: 12      Goal 2: Improve FOTO score to predicted score of 59 from initial score of 48 for optimal outcomes.    Sessions: 11      Goal 3: Improve C/S AROM Rot to at least 70 degrees to improve safety with driving.    Sessions: 12      Goal 4: Improve MMT B UE to at least 4+/5 all planes to improve lifting without increased pain.    Sessions: 62                                Ralene Muskrat, DPT

## 2019-12-02 ENCOUNTER — Inpatient Hospital Stay: Payer: 59 | Attending: Internal Medicine

## 2019-12-02 DIAGNOSIS — M542 Cervicalgia: Secondary | ICD-10-CM

## 2019-12-02 NOTE — PT/OT Therapy Note (Signed)
Name: Vanessa Coleman Age: 39 y.o.   Date of Service: 12/02/2019  Referring Physician: Mauro Kaufmann, MD   Date of Injury: 10/02/2019  Date Care Plan Established/Reviewed: 11/28/2019  Date Treatment Started: 11/28/2019  End of Certification Date: 02/25/2020  Sessions in Plan of Care: 12  Surgery Date: No data was found    Visit Count: 2   Diagnosis:   1. Cervicalgia        Subjective     Pain   Current pain rating: 9  Patient states she had difficulty getting her self out of bed ,because her neck is hurting. Patient states it feels like she needs her head pulled. Patient sates she is challenged with turning her head to the right. Patient statets she has a headache.     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      Precautions: No data was found  Allergies: Meloxicam and Penicillins        AROM: Cervical Spine    Initial         Flexion 40         Extension 31           R L R L   Rotation 65 54       Side Bending 25 26       (blank fields were intentionally left blank)     Initial R    R UE Strength  MMT /5 Initial  L    L   3+   Shoulder Flexion 3+     4   Shoulder Abduction 4     4+   Shoulder Extension 4+     4   Shoulder External Rotation 4     4+   Shoulder Internal Rotation 4+     4+   Biceps 4+     5   Triceps 5     (blank fields were intentionally left blank)     Strength      Left Hand      Grip (2nd hand position)     Trial 1: 50    Trial 2: 38    Trial 3: 40    Average: 42.67     Right Hand      Grip (2nd hand position)     Trial 1: 45    Trial 2: 50    Trial 3: 45    Average: 46.67     Outcome Measure   Tool Used/Details: FOTO (11)  Score: 48  Predicted Functional Outcome: 59              Treatment     Therapeutic Exercises - Justified to address any of the following: develop strength, endurance, ROM and/or flexibility.   COMBO Bike,x 6 min with subjective to start today s session   Open Book x 5 with 5 sec  hold    Supine cervical retraction x 15 TC for form  Supine cervical rotation x 10 5 sec hold         review HEP 11/1    Neuromuscular Re-Education - Justified to address any of the following: of movement, balance, coordination, kinesthetic sense, posture and/or proprioception for sitting and/or standing activities.    scapular strengthening seated with VC to activate his scapula x 15 5 sec hold    standing B ER YTB x 15  Standing YTB H abd uni  Supine H Abd x 15 5 sec hold    Manual Therapy - Justified to address any of the following:  Mobilization of joints and soft tissues, manipulation, manual lymphatic drainage, and/or manual traction.    STM/DTM UT/CS PVM,   M/L glides C2-T2 grade 3 supine  Head ache mobs grade 3   SOR x 2 30 sec hold  Stretch upper traps supine    Instructed patient in self head ache mob with towel and with her thumbs x 2 30 sec hold    Home Exercises   Home Exercises   Access Code: [email or web address]  URL: [email or web address]  Date: 11/28/2019  Prepared by: Raynelle Fanning Rimel    Exercises  Seated Passive Cervical Retraction - 5 x daily - 5 x weekly - 1 sets - 5 reps - 5 hold  Seated Scapular Retraction - 5 x daily - 5 x weekly - 1 sets - 5 reps - 5 hold  Sidelying Open Book Thoracic Lumbar Rotation and Extension - 2 x daily - 5 x weekly - 1 sets - 10 reps - 10 hold  Doorway Pec Stretch at 90 Degrees Abduction - 2 x daily - 5 x weekly - 1 sets - 3 reps - 30 hold         ---      ---   Total Time    Timed Minutes 45 minutes   Total Time 45 minutes        Assessment   Patient reports after the session her head ache was gone, and her pain was 0/10 from a 9/10. Patient has popping in her shoulder region on the left with open book but no increase in pain in shoulder or neck. Patient needs VC and TC for correct form with scapula retraction. Updatd HEP with patient and reviewed.   Plan   Continue with POC, add in T band row, extension      Goals    Goal 1: Patient will demonstrate independence in  prescribed HEP with proper form, sets and reps for safe discharge to an independent program.   Sessions: 12      Goal 2: Improve FOTO score to predicted score of 59 from initial score of 48 for optimal outcomes.    Sessions: 11      Goal 3: Improve C/S AROM Rot to at least 70 degrees to improve safety with driving.    Sessions: 12      Goal 4: Improve MMT B UE to at least 4+/5 all planes to improve lifting without increased pain.    Sessions: [address]                                Denton Lank, [place]

## 2019-12-13 ENCOUNTER — Inpatient Hospital Stay: Payer: 59

## 2019-12-16 ENCOUNTER — Inpatient Hospital Stay: Payer: 59 | Attending: Internal Medicine | Admitting: Rehabilitative and Restorative Service Providers"

## 2019-12-16 DIAGNOSIS — M542 Cervicalgia: Secondary | ICD-10-CM

## 2019-12-16 NOTE — PT/OT Therapy Note (Signed)
Name: Maisey Deandrade Age: 39 y.o.   Date of Service: 12/16/2019  Referring Physician: Mauro Kaufmann, MD   Date of Injury: 10/02/2019  Date Care Plan Established/Reviewed: 11/28/2019  Date Treatment Started: 11/28/2019  End of Certification Date: 02/25/2020  Sessions in Plan of Care: 12  Surgery Date: No data was found    Visit Count: 3   Diagnosis:   1. Cervicalgia        Subjective     Pain   Current pain rating: 7  Much better from last session.  I keep doing the exercises that you gave me.  Still having a hard time turning head to the right.  I notice it more with driving. No HA today.     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      Precautions: No data was found  Allergies: Meloxicam and Penicillins              AROM: Cervical Spine    Initial         Flexion 40         Extension 31           R L R L   Rotation 65 54       Side Bending 25 26       (blank fields were intentionally left blank)     Initial R    R UE Strength  MMT /5 Initial  L    L   3+   Shoulder Flexion 3+     4   Shoulder Abduction 4     4+   Shoulder Extension 4+     4   Shoulder External Rotation 4     4+   Shoulder Internal Rotation 4+     4+   Biceps 4+     5   Triceps 5     (blank fields were intentionally left blank)     Strength      Left Hand      Grip (2nd hand position)     Trial 1: 50    Trial 2: 38    Trial 3: 40    Average: 42.67     Right Hand      Grip (2nd hand position)     Trial 1: 45    Trial 2: 50    Trial 3: 45    Average: 46.67     Outcome Measure   Tool Used/Details: FOTO (11)  Score: 48  Predicted Functional Outcome: 59              Treatment     Therapeutic Exercises - Justified to address any of the following: develop strength, endurance, ROM and/or flexibility.   COMBO Bike,x 6 min with subjective to start today s session   Open Book x 5 with 5 sec hold  -added upper trap stretch, scalenes stretch, seated 2 x 30 sec  each  -added DWS 2 position  2 x 30 sec each,   Supine cervical retraction x 15 TC for form  Supine cervical rotation x 10 5 sec hold         review HEP 11/1    Neuromuscular Re-Education - Justified to address any of the following: of movement, balance, coordination, kinesthetic sense, posture and/or proprioception for sitting and/or standing activities.   -scapular retraction x 10 with 3 sec hold with cues for  proper form and to reduce UT compensation  -supine H Abd RTB x 15 with 3 sec hold with verbal cues to focus on scapular retraction  -standing B ER RTB 2 x 10  -Standing   H abd uni x 10 with focus on proper scapular control         Manual Therapy - Justified to address any of the following:  Mobilization of joints and soft tissues, manipulation, manual lymphatic drainage, and/or manual traction.    STM/DTM UT/CS PVM,    SOR x 2 30 sec hold            Therapeutic Activity - Justified to address the following: activities to improve functional performance.  Educated patient in use of Tennis Ball TPR for [employer], C/S and T/S PVM, Rhomboids.     Home Exercises   Home Exercises   Access Code: [email or web address]  URL: [email or web address]  Date: 11/28/2019  Prepared by: Raynelle Fanning Rimel    Exercises  Seated Passive Cervical Retraction - 5 x daily - 5 x weekly - 1 sets - 5 reps - 5 hold  Seated Scapular Retraction - 5 x daily - 5 x weekly - 1 sets - 5 reps - 5 hold  Sidelying Open Book Thoracic Lumbar Rotation and Extension - 2 x daily - 5 x weekly - 1 sets - 10 reps - 10 hold  Doorway Pec Stretch at 90 Degrees Abduction - 2 x daily - 5 x weekly - 1 sets - 3 reps - 30 hold         ---      ---   Total Time    Timed Minutes 43 minutes   Total Time 43 minutes        Assessment   Thula demonstrated good understanding of use of Tennis Ball for self TPR.  She continues to demonstrate signficant UT tightness and overall poor postural awareness with forward flexed posture without cues.  Good form with DWS after review.   Overall, pain is improving, and patient did not have HA today.   Plan   Continue with POC, add in T band row, extension next session, as ran out of time today.       Goals    Goal 1: Patient will demonstrate independence in prescribed HEP with proper form, sets and reps for safe discharge to an independent program.   Sessions: 12      Goal 2: Improve FOTO score to predicted score of 59 from initial score of 48 for optimal outcomes.    Sessions: 11      Goal 3: Improve C/S AROM Rot to at least 70 degrees to improve safety with driving.    Sessions: 12      Goal 4: Improve MMT B UE to at least 4+/5 all planes to improve lifting without increased pain.    Sessions: 71                                Ralene Muskrat, DPT

## 2019-12-18 ENCOUNTER — Inpatient Hospital Stay: Payer: 59 | Attending: Internal Medicine

## 2019-12-18 DIAGNOSIS — M542 Cervicalgia: Secondary | ICD-10-CM

## 2019-12-18 NOTE — PT/OT Therapy Note (Signed)
Name: Vanessa Coleman Age: 39 y.o.   Date of Service: 12/18/2019  Referring Physician: Mauro Kaufmann, MD   Date of Injury: 10/02/2019  Date Care Plan Established/Reviewed: 11/28/2019  Date Treatment Started: 11/28/2019  End of Certification Date: 02/25/2020  Sessions in Plan of Care: 12  Surgery Date: No data was found    Visit Count: 4   Diagnosis:   1. Cervicalgia        Subjective     Pain   Current pain rating: 8  Patient states 2 days ago she got he Covid shot and flu shot same day says she has been achy in every joint since. Before she got the shots she felt much better in her neck.     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      Precautions: No data was found  Allergies: Meloxicam and Penicillins        AROM: Cervical Spine    Initial         Flexion 40         Extension 31           R L R L   Rotation 65 54       Side Bending 25 26       (blank fields were intentionally left blank)     Initial R    R UE Strength  MMT /5 Initial  L    L   3+   Shoulder Flexion 3+     4   Shoulder Abduction 4     4+   Shoulder Extension 4+     4   Shoulder External Rotation 4     4+   Shoulder Internal Rotation 4+     4+   Biceps 4+     5   Triceps 5     (blank fields were intentionally left blank)     Strength      Left Hand      Grip (2nd hand position)     Trial 1: 50    Trial 2: 38    Trial 3: 40    Average: 42.67     Right Hand      Grip (2nd hand position)     Trial 1: 45    Trial 2: 50    Trial 3: 45    Average: 46.67     Outcome Measure   Tool Used/Details: FOTO (11)  Score: 48  Predicted Functional Outcome: 59              Treatment     Therapeutic Exercises - Justified to address any of the following: develop strength, endurance, ROM and/or flexibility.   COMBO Bike,x 6 min with subjective to start today s session   Open Book x 5 with 5 sec hold   upper trap stretch, scalenes stretch, seated 2 x 30 sec each  DWS 2  position  2 x 30 sec each nt  Supine cervical retraction x 15 TC for form  Added supine chin tuck x 15 5 sec hold  Supine cervical rotation x 10 5 sec hold  Added wheel up the wall x 10             Neuromuscular Re-Education - Justified to address any of the following: of movement, balance, coordination, kinesthetic sense, posture and/or proprioception for sitting and/or standing activities.   -scapular retraction  x 10 with 3 sec hold with cues for proper form and to reduce UT compensation  -supine H abd x 15 with 3 sec hold with verbal cues to focus on scapular retraction no band do to soreness form shots  Added YTB row, x 15 5 sec hold with cue to activate her scapula  Added YTB ext x 15 5 sec hold    Not today  -standing B ER RTB 2 x 10  -Standing   H abd uni x 10 with focus on proper scapular control         Manual Therapy - Justified to address any of the following:  Mobilization of joints and soft tissues, manipulation, manual lymphatic drainage, and/or manual traction.    STM/DTM UT/CS PVM,    SOR x 2 30 sec hold            Therapeutic Activity - Justified to address the following: activities to improve functional performance.  .     Home Exercises   Home Exercises   Access Code: [email or web address]  URL: [email or web address]  Date: 11/28/2019  Prepared by: Raynelle Fanning Rimel    Exercises  Seated Passive Cervical Retraction - 5 x daily - 5 x weekly - 1 sets - 5 reps - 5 hold  Seated Scapular Retraction - 5 x daily - 5 x weekly - 1 sets - 5 reps - 5 hold  Sidelying Open Book Thoracic Lumbar Rotation and Extension - 2 x daily - 5 x weekly - 1 sets - 10 reps - 10 hold  Doorway Pec Stretch at 90 Degrees Abduction - 2 x daily - 5 x weekly - 1 sets - 3 reps - 30 hold         ---      ---   Total Time    Timed Minutes 43 minutes   Total Time 43 minutes        Assessment   Patient states at the end of the session her pain went form a 8/10 to 6/10 and she was able to turn her neck with less pain. Patient has tightness in  the right upper trap with massage more than the left. Patient has popping in the left shoulder region with row, no pain she reports. Had patient re adjust her posture and symptoms resolved.   Plan   Continue with POC, add in prone T,V,W      Goals    Goal 1: Patient will demonstrate independence in prescribed HEP with proper form, sets and reps for safe discharge to an independent program.   Sessions: 12      Goal 2: Improve FOTO score to predicted score of 59 from initial score of 48 for optimal outcomes.    Sessions: 11      Goal 3: Improve C/S AROM Rot to at least 70 degrees to improve safety with driving.    Sessions: 12      Goal 4: Improve MMT B UE to at least 4+/5 all planes to improve lifting without increased pain.    Sessions: [address]                                [place], [place]

## 2019-12-23 ENCOUNTER — Inpatient Hospital Stay: Payer: 59 | Attending: Internal Medicine

## 2019-12-23 DIAGNOSIS — M542 Cervicalgia: Secondary | ICD-10-CM

## 2019-12-23 NOTE — PT/OT Therapy Note (Signed)
Name: Vanessa Coleman Age: 39 y.o.   Date of Service: 12/23/2019  Referring Physician: Mauro Kaufmann, MD   Date of Injury: 10/02/2019  Date Care Plan Established/Reviewed: 11/28/2019  Date Treatment Started: 11/28/2019  End of Certification Date: 02/25/2020  Sessions in Plan of Care: 12  Surgery Date: No data was found    Visit Count: 5   Diagnosis:   1. Cervicalgia        Subjective     Pain   Current pain rating: 0  Have no pain she states, says she has some tenderness, but in all feeling better. Still challenged turning her head to the right driving her car. No head aches. Still challenged with carrying 2 gallons of water in her hand. Patient states she can walk 30 min if longer she starts to have pain.     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     With driving, hard to turn to look over shoulder, R > L ( no issues with driving); with increased activity and movement, increased incidence of HA that last for few hours and take medicine to control (HA have changed from front sinus to back of head now); unable to carry 2 gallons of water in one hand (no issues with carrying gallons of water); increased neck and upper back pain with walking for 45 min (able to walk for an hour for exercise without issue);          Precautions: No data was found  Allergies: Meloxicam and Penicillins        Outcome Measure   Tool Used/Details: FOTO (11)  Score: 48  Predicted Functional Outcome: 59      FOTO 11/22  Score 52     Strength      Left Hand      Grip (2nd hand position)     Trial 1: 50    Trial 2: 38    Trial 3: 40    Average: 42.67     Right Hand      Grip (2nd hand position)     Trial 1: 45    Trial 2: 50    Trial 3: 45    Average: 46.67     AROM: Cervical Spine    Initial         Flexion 40         Extension 31           R L R L   Rotation 65 54       Side Bending 25 26       (blank fields were intentionally left  blank)     Initial R    R UE Strength  MMT /5 Initial  L    L   3+   Shoulder Flexion 3+     4   Shoulder Abduction 4     4+   Shoulder Extension 4+     4   Shoulder External Rotation 4     4+   Shoulder Internal Rotation 4+     4+   Biceps 4+     5   Triceps 5     (blank fields were intentionally left blank)              Treatment     Therapeutic Exercises - Justified to address any of the following: develop strength, endurance, ROM and/or flexibility.  COMBO Bike,x 6 min with subjective to start today s session     Open Book x 5 with 5 sec hold   upper trap stretch, scalenes stretch, seated 2 x 30 sec each    Supine cervical retraction x 15 TC for form   supine chin tuck x 15 5 sec hold  Supine cervical rotation x 10 5 sec hold                   Neuromuscular Re-Education - Justified to address any of the following: of movement, balance, coordination, kinesthetic sense, posture and/or proprioception for sitting and/or standing activities.   -scapular retraction x 10 with 3 sec hold with cues for proper form and to reduce UT compensation  -supine H abd x 15 with 3 sec hold with verbal cues to focus on scapular retraction with YTB   YTB row, x 15 5 sec hold with cue to activate her scapula       Added seated thoracic extension x 10 5 sec hold with 1/2 foam roll in chair behind thoracic    Not today 11/22    YTB ext x 15 5 sec hold  added cat and camel x 10 5 sec hold with cue for head position    Manual Therapy - Justified to address any of the following:  Mobilization of joints and soft tissues, manipulation, manual lymphatic drainage, and/or manual traction.    STM/DTM UT/CS PVM,    SOR x 2 30 sec hold  Added TP release to left UT with cervical rotation to the right    FOTO and reviewed 11/22            Therapeutic Activity - Justified to address the following: activities to improve functional performance.  .     Home Exercises   Home Exercises   Access Code: [email or web address]  URL: [email or web address]  Date:  11/28/2019  Prepared by: Raynelle Fanning Rimel    Access Code: [email or web address]  URL: [email or web address]  Date: 12/23/2019  Prepared by: Cira Rue    Exercises  .Seated Passive Cervical Retraction - 5 x daily - 5 x weekly - 1 sets - 5 reps - 5 hold  .Seated Scapular Retraction - 5 x daily - 5 x weekly - 1 sets - 5 reps - 5 hold  .Sidelying Open Book Thoracic Lumbar Rotation and Extension - 2 x daily - 5 x weekly - 1 sets - 10 reps - 10 hold  .Doorway Pec Stretch at 90 Degrees Abduction - 2 x daily - 5 x weekly - 1 sets - 3 reps - 30 hold  .Standing Shoulder Row with Anchored Resistance - 1 x daily - 3 x weekly - 1 sets - 10 reps - 5 hold  .Shoulder extension with resistance - Neutral - 1 x daily - 3 x weekly - 1 sets - 10 reps - 5 hold  .Standing Shoulder External Rotation with Resistance - 1 x daily - 3 x weekly - 1 sets - 10 reps - 5 hold  .Standing Shoulder Horizontal Abduction with Resistance - 1 x daily - 3 x weekly - 1 sets - 10 reps - 5 hold  .Supine Scapular Protraction in Flexion with Dumbbells - 1 x daily - 3 x weekly - 1 sets - 10 reps - 5 hold  .Cat-Camel - 1 x daily - 3 x weekly - 1 sets - 10 reps - 5 hold           ---      ---  Total Time    Timed Minutes 42 minutes   Total Time 42 minutes        Assessment   Patient has tissue tightness more so on her left upper trap than her right with massage. TP release to left upper trap with cervical head motion to the right. Patient has improved FOTO from 48 to 52 since the initial evaluation with predicted score of 59. Patient was given up dated HEP. Patient has tightness in her thoracic region as well as her upper traps as evident with cat and camel and thoracic extension.   Plan   Continue with POC, add in up to the right upper trap to release tissue tension or IASTM      Goals    Goal 1: Patient will demonstrate independence in prescribed HEP with proper form, sets and reps for safe discharge to an independent program.  Updated HEP 11/22 dp   Sessions: 12       Goal 2: Improve FOTO score to predicted score of 59 from initial score of 48 for optimal outcomes.   FOTO IE 48  FOTO 52 11/22 progressing dp   Sessions: 11      Goal 3: Improve C/S AROM Rot to at least 70 degrees to improve safety with driving.    Sessions: 12      Goal 4: Improve MMT B UE to at least 4+/5 all planes to improve lifting without increased pain.    Sessions: [address]                                [place], [place]

## 2019-12-31 ENCOUNTER — Inpatient Hospital Stay: Payer: 59 | Attending: Internal Medicine

## 2019-12-31 DIAGNOSIS — M542 Cervicalgia: Secondary | ICD-10-CM | POA: Insufficient documentation

## 2019-12-31 NOTE — PT/OT Therapy Note (Signed)
Name: Vanessa Coleman Age: 39 y.o.   Date of Service: 12/31/2019  Referring Physician: Mauro Kaufmann, MD   Date of Injury: 10/02/2019  Date Care Plan Established/Reviewed: 11/28/2019  Date Treatment Started: 11/28/2019  End of Certification Date: 02/25/2020  Sessions in Plan of Care: 12  Surgery Date: No data was found    Visit Count: 6   Diagnosis:   1. Cervicalgia        Subjective     Pain   Current pain rating: 7  Have the most pain when I turn my head she states. Patient has the most pain when she sleeps.     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      Precautions: No data was found  Allergies: Meloxicam and Penicillins        Outcome Measure   Tool Used/Details: FOTO (11)  Score: 48  Predicted Functional Outcome: 59       FOTO 11/22  Score 52     Strength      Left Hand      Grip (2nd hand position)     Trial 1: 50    Trial 2: 38    Trial 3: 40    Average: 42.67     Right Hand      Grip (2nd hand position)     Trial 1: 45    Trial 2: 50    Trial 3: 45    Average: 46.67     AROM: Cervical Spine    Initial   11/30      Flexion 40   50      Extension 31    40       R L R L   Rotation 65 54  65  65   Side Bending 25 26       (blank fields were intentionally left blank)     Initial R    R UE Strength  MMT /5 Initial  L    L   3+   Shoulder Flexion 3+     4   Shoulder Abduction 4     4+   Shoulder Extension 4+     4   Shoulder External Rotation 4     4+   Shoulder Internal Rotation 4+     4+   Biceps 4+     5   Triceps 5     (blank fields were intentionally left blank)              Treatment     Therapeutic Exercises - Justified to address any of the following: develop strength, endurance, ROM and/or flexibility.   COMBO Bike,x 6 min with subjective to start today s session nt pt 15 min late 1/30        seated cervical rotation x 10 5 sec hold      Not today 11/30    Open Book x 5 with 5 sec hold   upper trap  stretch, scalenes stretch, seated 2 x 30 sec each          Neuromuscular Re-Education - Justified to address any of the following: of movement, balance, coordination, kinesthetic sense, posture and/or proprioception for sitting and/or standing activities.   -scapular retraction x 10 with 3 sec hold with cues for proper form and to reduce UT compensation  -supine H abd x 15 with 3 sec hold  with verbal cues to focus on scapular retraction with YTB   RTB row, x 15 5 sec hold with cue to activate her scapula progressed color of band  Added RTB  Ext x 15 with cue to activate her scapula  Added red T band star x  3 points x 5 ea  Added red T band lats x 15 with cue for scapula activation    seated thoracic extension x 10 5 sec hold with 1/2 foam roll in chair behind thoracic      Manual Therapy - Justified to address any of the following:  Mobilization of joints and soft tissues, manipulation, manual lymphatic drainage, and/or manual traction.    STM/DTM UT/CS PVM,    SOR x 2 30 sec hold nt  Added cup to upper trap with cervical rotation and side bending, patient advised cup could leave red marks but will go away  See cervical measurements 11/30                Therapeutic Activity - Justified to address the following: activities to improve functional performance.  .     Home Exercises   Home Exercises   Access Code: [email or web address]  URL: [email or web address]  Date: 11/28/2019  Prepared by: Raynelle Fanning Rimel    Access Code: [email or web address]  URL: [email or web address]  Date: 12/23/2019  Prepared by: Cira Rue    Exercises  .Seated Passive Cervical Retraction - 5 x daily - 5 x weekly - 1 sets - 5 reps - 5 hold  .Seated Scapular Retraction - 5 x daily - 5 x weekly - 1 sets - 5 reps - 5 hold  .Sidelying Open Book Thoracic Lumbar Rotation and Extension - 2 x daily - 5 x weekly - 1 sets - 10 reps - 10 hold  .Doorway Pec Stretch at 90 Degrees Abduction - 2 x daily - 5 x weekly - 1 sets - 3 reps - 30 hold  .Standing  Shoulder Row with Anchored Resistance - 1 x daily - 3 x weekly - 1 sets - 10 reps - 5 hold  .Shoulder extension with resistance - Neutral - 1 x daily - 3 x weekly - 1 sets - 10 reps - 5 hold  .Standing Shoulder External Rotation with Resistance - 1 x daily - 3 x weekly - 1 sets - 10 reps - 5 hold  .Standing Shoulder Horizontal Abduction with Resistance - 1 x daily - 3 x weekly - 1 sets - 10 reps - 5 hold  .Supine Scapular Protraction in Flexion with Dumbbells - 1 x daily - 3 x weekly - 1 sets - 10 reps - 5 hold  .Cat-Camel - 1 x daily - 3 x weekly - 1 sets - 10 reps - 5 hold           ---      ---   Total Time    Timed Minutes 30 minutes   Total Time 30 minutes        Assessment   Patent has improved AROM of the cervical spine in flexion from 40-50, ext 31 to 40, and left cervical rotation 54 to 65. Patient after cup reported less tissue tension and pain reduced to 0/10 from a 6/10 she reports. Patient needs cue to activate her scapula region and avoid upper trap compensation.   Plan   Continue with PO, add in reach under and reach up      Goals    Goal 1: Patient will demonstrate independence in  prescribed HEP with proper form, sets and reps for safe discharge to an independent program.  Updated HEP 11/22 dp   Sessions: 12      Goal 2: Improve FOTO score to predicted score of 59 from initial score of 48 for optimal outcomes.   FOTO IE 48  FOTO 52 11/22 progressing dp   Sessions: 11      Goal 3: Improve C/S AROM Rot to at least 70 degrees to improve safety with driving.     cervical rotation right 65 left 65 progressing dp   Sessions: 12      Goal 4: Improve MMT B UE to at least 4+/5 all planes to improve lifting without increased pain.    Sessions: [address]                                [place], [place]

## 2020-01-02 ENCOUNTER — Inpatient Hospital Stay: Payer: 59 | Attending: Internal Medicine

## 2020-01-02 DIAGNOSIS — M542 Cervicalgia: Secondary | ICD-10-CM | POA: Insufficient documentation

## 2020-01-02 NOTE — PT/OT Therapy Note (Signed)
Name: Vanessa Coleman Age: 39 y.o.   Date of Service: 01/02/2020  Referring Physician: Mauro Kaufmann, MD   Date of Injury: 10/02/2019  Date Care Plan Established/Reviewed: 11/28/2019  Date Treatment Started: 11/28/2019  End of Certification Date: 02/25/2020  Sessions in Plan of Care: 12  Surgery Date: No data was found    Visit Count: 7   Diagnosis:   1. Cervicalgia        Subjective     Pain   Current pain rating: 0  Feel much better says the cup really helped release some of the tissue tension. Patient states she is still challenged with sleeping at night. Patient states still challenged with turning her head to the right with driving but it is better than since the initial evaluation. No head aches any more. Have not tried to carry water in her hands yet. Walking is fine now.     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    : With driving, hard to turn to look over shoulder, R > L ( no issues with driving); with increased activity and movement, increased incidence of HA that last for few hours and take medicine to control (HA have changed from front sinus to back of head now); unable to carry 2 gallons of water in one hand (no issues with carrying gallons of water); increased neck and upper back pain with walking for 45 min (able to walk for an hour for exercise without issue);          Precautions: No data was found  Allergies: Meloxicam and Penicillins        Outcome Measure   Tool Used/Details: FOTO (11)  Score: 48  Predicted Functional Outcome: 59       FOTO 11/22  Score 52     Strength      Left Hand      Grip (2nd hand position)     Trial 1: 50    Trial 2: 38    Trial 3: 40    Average: 42.67     Right Hand      Grip (2nd hand position)     Trial 1: 45    Trial 2: 50    Trial 3: 45    Average: 46.67     AROM: Cervical Spine    Initial   11/30      Flexion 40   50      Extension 31    40       R L R L    Rotation 65 54  65  65   Side Bending 25 26       (blank fields were intentionally left blank)     Initial R 12/2   R UE Strength  MMT /5 Initial  L  12/2  L   3+  4+ Shoulder Flexion 3+ 4+    4  4+ Shoulder Abduction 4  4+   4+   Shoulder Extension 4+     4  4+ Shoulder External Rotation 4  4+   4+ 4+  Shoulder Internal Rotation 4+  4+   4+ 5  Biceps 4+ 5    5 5   Triceps 5 5    (blank fields were intentionally left blank)                 Treatment     Therapeutic Exercises -  Justified to address any of the following: develop strength, endurance, ROM and/or flexibility.   COMBO Bike,x 6 min with subjective to start today s session     seated cervical rotation x 10 5 sec hold   upper trap stretch, scalenes stretch, seated 2 x 30 sec each  Not today 12/2    Open Book x 5 with 5 sec hold            Neuromuscular Re-Education - Justified to address any of the following: of movement, balance, coordination, kinesthetic sense, posture and/or proprioception for sitting and/or standing activities.   -scapular retraction x 10 with 3 sec hold with cues for proper form and to reduce UT compensation  -supine H abd x 15 with 3 sec hold with verbal cues to focus on scapular retraction with YTB   RTB row, x 15 5 sec hold with cue to activate her scapula progressed color of band   RTB  Ext x 15 with cue to activate her scapula  Added YTB walk up the wall with lift off x 10 3 sec hold   red T band lats x 15 with cue for scapula activation    seated thoracic extension x 10 5 sec hold with 1/2 foam roll in chair behind thoracic  add in reach under and reach up x 5 5 ea    Not today   red T band star x  3 points x 5 ea      Manual Therapy - Justified to address any of the following:  Mobilization of joints and soft tissues, manipulation, manual lymphatic drainage, and/or manual traction.    STM/DTM UT/CS PVM,    SOR x 2 30 sec hold    cup to upper trap with cervical rotation and side bending, patient advised cup could leave red marks  but will go away  Added mulligan mob to the right C4-5 level with over pressure by the patient  See strength 12/2                Therapeutic Activity - Justified to address the following: activities to improve functional performance.  .     Home Exercises   Home Exercises   Access Code: [email or web address]  URL: [email or web address]  Date: 11/28/2019  Prepared by: Raynelle Fanning Rimel    Access Code: [email or web address]  URL: [email or web address]  Date: 12/23/2019  Prepared by: Cira Rue    Exercises  .Seated Passive Cervical Retraction - 5 x daily - 5 x weekly - 1 sets - 5 reps - 5 hold  .Seated Scapular Retraction - 5 x daily - 5 x weekly - 1 sets - 5 reps - 5 hold  .Sidelying Open Book Thoracic Lumbar Rotation and Extension - 2 x daily - 5 x weekly - 1 sets - 10 reps - 10 hold  .Doorway Pec Stretch at 90 Degrees Abduction - 2 x daily - 5 x weekly - 1 sets - 3 reps - 30 hold  .Standing Shoulder Row with Anchored Resistance - 1 x daily - 3 x weekly - 1 sets - 10 reps - 5 hold  .Shoulder extension with resistance - Neutral - 1 x daily - 3 x weekly - 1 sets - 10 reps - 5 hold  .Standing Shoulder External Rotation with Resistance - 1 x daily - 3 x weekly - 1 sets - 10 reps - 5 hold  .Standing Shoulder Horizontal Abduction with Resistance - 1 x daily - 3 x weekly - 1 sets -  10 reps - 5 hold  .Supine Scapular Protraction in Flexion with Dumbbells - 1 x daily - 3 x weekly - 1 sets - 10 reps - 5 hold  .Cat-Camel - 1 x daily - 3 x weekly - 1 sets - 10 reps - 5 hold           ---      ---   Total Time    Timed Minutes 45 minutes   Total Time 45 minutes        Assessment   Patient has improved strength in her UE since the initial evaluation. Patient after mulligan mob had improved right cervical rotation. Patient has less tissue tension in her upper traps compared to the last visit. Patient reports reach under and up she had tightness but it felt good she states. Patient reports at end of session she was turning her ead to the  right with more ease.   Plan   Continue with POC, access if ex first vs last seemed to be better. Add in farmer carry next visit      Goals    Goal 1: Patient will demonstrate independence in prescribed HEP with proper form, sets and reps for safe discharge to an independent program.  Updated HEP 11/22 dp   Sessions: 12      Goal 2: Improve FOTO score to predicted score of 59 from initial score of 48 for optimal outcomes.   FOTO IE 48  FOTO 52 11/22 progressing dp   Sessions: 11      Goal 3: Improve C/S AROM Rot to at least 70 degrees to improve safety with driving.     cervical rotation right 65 left 65 progressing dp   Sessions: 12      Goal 4: Improve MMT B UE to at least 4+/5 all planes to improve lifting without increased pain.    Sessions: [address]                                [place], [place]

## 2020-01-06 ENCOUNTER — Inpatient Hospital Stay: Payer: 59 | Attending: Internal Medicine | Admitting: Rehabilitative and Restorative Service Providers"

## 2020-01-06 DIAGNOSIS — M542 Cervicalgia: Secondary | ICD-10-CM | POA: Insufficient documentation

## 2020-01-06 NOTE — Progress Notes (Signed)
Name:Vanessa Coleman Age: 39 y.o.   Date of Service: 01/06/2020  Referring Physician: Mauro Kaufmann, MD   Date of Injury: 10/02/2019  Date Care Plan Established/Reviewed: 11/28/2019  Date Treatment Started: 11/28/2019  End of Certification Date: 02/25/2020  Sessions in Plan of Care: 12  Surgery Date: No data was found      Visit Count: 8   Diagnosis:   1. Cervicalgia      Subjective     Pain   I am having tenderness and discomfort today with a HA.  Not sure how to grade that.  Since PT, the HA are more infrequent.  Not sure what caused today's HA. Didn't wake up with it, but it did start this am. Now, I am not having as much discomfort with turning my head to right.  Still tender, but much improved.  I am still having issues with sleeping as well.  I have tried a few bras, but have not found the right size yet.     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    Functional Limitations: IE: With driving, hard to turn to look over shoulder, R > L 01/06/20:  Improved with turning to R, uncomfortable, but have the movement( no issues with driving); IE:  with increased activity and movement, increased incidence of HA that last for few hours and take medicine to control 01/06/20:  Improved by at least 50% and no longer get daily HA (HA have changed from front sinus to back of head now); IE: unable to carry 2 gallons of water in one hand 01/06/20:  Have not tried yet, as afraid to increase pain (no issues with carrying gallons of water); IE:  increased neck and upper back pain with walking for 45 min 01/06/20:  Reduced pain, but do get fatigue in back of neck with walking (able to walk for an hour for exercise without issue);     Strength      Left Hand IE                            01/06/20     Grip (2nd hand position)     Trial 1: 50                                40    Trial 2: 38                                35    Trial 3:  40                                36    Average: 42.67                       Avg:  37 lbs     Right Hand      Grip (2nd hand position)     Trial 1: 45                                40    Trial 2: 50  45    Trial 3: 45                                47    Average: 46.67                       Avg:  44 lbs      AROM: Cervical Spine    Initial   12/6      Flexion 40   46     Extension 31    40       R L R L   Rotation 65 54  75 72   Side Bending 25 26  34  35   (blank fields were intentionally left blank)     Initial R 12/2   R UE Strength  MMT /5 Initial  L  12/2  L   3+  4+ Shoulder Flexion 3+ 4+    4  4+ Shoulder Abduction 4  4+   4+  4+ Shoulder Extension 4+  4+   4  4+ Shoulder External Rotation 4  4+   4+ 4+  Shoulder Internal Rotation 4+  4+   4+ 5  Biceps 4+ 5    5 5   Triceps 5 5    (blank fields were intentionally left blank)    Assessment   Camry has treated in PT for 8 of 12 sessions currently and is having less incidence of HA.  Her AROM c/s Rot is also much improved.  Mild improvement noted in FOTO score.  She continues to have significant tenderness in her R UT, and continues to require some cues for postural awareness.  She did demonstrate ability to lift and carry 15 lbs in each hand today, but did have some discomfort on R side.   Plan   Continue with POC       Precautions: No data was found  Allergies: Meloxicam and Penicillins    Past Medical History:   Diagnosis Date   . Anemia    . Arthritis    . Depression    . Headache                          ---      ---   Total Time    Timed Minutes 43 minutes   Total Time 43 minutes           Goals    Goal 1: Patient will demonstrate independence in prescribed HEP with proper form, sets and reps for safe discharge to an independent program.    Updated HEP 11/22 dp  01/06/20:  Compliance with exercises daily.    Sessions: 12      Goal 2: Improve FOTO score to predicted score of 59 from initial score of 48 for optimal outcomes.       12/23/19: Progressed to FOTO 52     Sessions: 11      Goal 3: Improve C/S AROM Rot to at least 70 degrees to improve safety with driving.     01/06/20:  AROM goal achieved at 75 to R and 72 to L.    Sessions: 12      Goal 4: Improve MMT B UE to at least 4+/5 all planes to improve lifting without increased pain.     01/06/20:  Strength goal achieved, but patient limited with  lifting due to fear.     Sessions: 52                                Ralene Muskrat, DPT

## 2020-01-06 NOTE — PT/OT Therapy Note (Signed)
Name: Vanessa Coleman Age: 39 y.o.   Date of Service: 01/06/2020  Referring Physician: Mauro Kaufmann, MD   Date of Injury: 10/02/2019  Date Care Plan Established/Reviewed: 11/28/2019  Date Treatment Started: 11/28/2019  End of Certification Date: 02/25/2020  Sessions in Plan of Care: 12  Surgery Date: No data was found    Visit Count: 8   Diagnosis:   1. Cervicalgia        Subjective     Pain   I am having tenderness and discomfort today with a HA.  Not sure how to grade that.  Since PT, the HA are more infrequent.  Not sure what caused today's HA. Didn't wake up with it, but it did start this am. Now, I am not having as much discomfort with turning my head to right.  Still tender, but much improved.  I am still having issues with sleeping as well.  I have tried a few bras, but have not found the right size yet.     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    Functional Limitations: IE: With driving, hard to turn to look over shoulder, R > L 01/06/20:  Improved with turning to R, uncomfortable, but have the movement( no issues with driving); IE:  with increased activity and movement, increased incidence of HA that last for few hours and take medicine to control 01/06/20:  Improved by at least 50% and no longer get daily HA (HA have changed from front sinus to back of head now); IE: unable to carry 2 gallons of water in one hand 01/06/20:  Have not tried yet, as afraid to increase pain (no issues with carrying gallons of water); IE:  increased neck and upper back pain with walking for 45 min 01/06/20:  Reduced pain, but do get fatigue in back of neck with walking (able to walk for an hour for exercise without issue);          Precautions: No data was found  Allergies: Meloxicam and Penicillins              Outcome Measure   Tool Used/Details: FOTO (11)  Score: IE:  48   11/22:  52  Predicted Functional Outcome: 59          Strength      Left Hand IE   01/06/20     Grip (2nd hand position)     Trial 1: 50   40    Trial 2: 38   35    Trial 3: 40   36    Average: 42.67  Avg:  37 lbs     Right Hand      Grip (2nd hand position)     Trial 1: 45   40    Trial 2: 50   45    Trial 3: 45   47    Average: 46.67  Avg:  44 lbs      AROM: Cervical Spine    Initial   12/6      Flexion 40   46     Extension 31    40       R L R L   Rotation 65 54  75 72   Side Bending 25 26  34  35   (blank fields were intentionally left blank)     Initial R 12/2   R UE Strength  MMT /  5 Initial  L  12/2  L   3+  4+ Shoulder Flexion 3+ 4+    4  4+ Shoulder Abduction 4  4+   4+  4+ Shoulder Extension 4+  4+   4  4+ Shoulder External Rotation 4  4+   4+ 4+  Shoulder Internal Rotation 4+  4+   4+ 5  Biceps 4+ 5    5 5   Triceps 5 5    (blank fields were intentionally left blank)                 Treatment     Therapeutic Exercises - Justified to address any of the following: develop strength, endurance, ROM and/or flexibility.   COMBO Bike,x 6 min with subjective to start today s session - deferred today due to in use    seated cervical rotation x 10 5 sec hold   upper trap stretch, scalenes stretch, seated 2 x 30 sec each    -added quadriped thoracic rotation with hand behind head             Neuromuscular Re-Education - Justified to address any of the following: of movement, balance, coordination, kinesthetic sense, posture and/or proprioception for sitting and/or standing activities.   -scapular retraction x 10 with 3 sec hold with cues for proper form and to reduce UT compensation  -supine H abd 2 x 10 with 3 sec hold with verbal cues to focus on scapular retraction with RTB-progressed band 12/6   RTB row, x 15 5 sec hold with cue to activate her scapula progressed color of band   RTB  Ext x 15 with cue to activate her scapula  Added YTB walk up the wall with lift off x 10 3 sec hold   red T band lats x 15 with cue for scapula activation    seated thoracic extension  x 10 5 sec hold with 1/2 foam roll in chair behind thoracic  -in reach under and reach up x 5 5 ea   red T band star x  3 points x 5 ea      Manual Therapy - Justified to address any of the following:  Mobilization of joints and soft tissues, manipulation, manual lymphatic drainage, and/or manual traction.    -STM/DTM UT/CS PVM,  With cupping to R UT with C/S Rotation and SB   SOR for HA management     ReAssess                  Therapeutic Activity - Justified to address the following: activities to improve functional performance.  . -reviewed lifting techniques floor to hip height 10 lbs and 15 lbs with review of squat to pick up and put back down.  Jimmye Norman carry 2 x 30 feet with 15lb ketterball in each hand  -reviewed that 15lb Kettleball 1 lb less than 2 gallons of water.  Patient reported had minor discomfort on R.  None on left, but felt more confident to use arm.     Home Exercises   Home Exercises   Access Code: [email or web address]  URL: [email or web address]  Date: 11/28/2019  Prepared by: Raynelle Fanning Rimel    Access Code: [email or web address]  URL: [email or web address]  Date: 12/23/2019  Prepared by: Cira Rue    Exercises  .Seated Passive Cervical Retraction - 5 x daily - 5 x weekly - 1 sets - 5 reps - 5 hold  .Seated Scapular Retraction - 5 x daily - 5 x weekly -  1 sets - 5 reps - 5 hold  .Sidelying Open Book Thoracic Lumbar Rotation and Extension - 2 x daily - 5 x weekly - 1 sets - 10 reps - 10 hold  .Doorway Pec Stretch at 90 Degrees Abduction - 2 x daily - 5 x weekly - 1 sets - 3 reps - 30 hold  .Standing Shoulder Row with Anchored Resistance - 1 x daily - 3 x weekly - 1 sets - 10 reps - 5 hold  .Shoulder extension with resistance - Neutral - 1 x daily - 3 x weekly - 1 sets - 10 reps - 5 hold  .Standing Shoulder External Rotation with Resistance - 1 x daily - 3 x weekly - 1 sets - 10 reps - 5 hold  .Standing Shoulder Horizontal Abduction with Resistance - 1 x daily - 3 x weekly - 1 sets - 10 reps - 5  hold  .Supine Scapular Protraction in Flexion with Dumbbells - 1 x daily - 3 x weekly - 1 sets - 10 reps - 5 hold  .Cat-Camel - 1 x daily - 3 x weekly - 1 sets - 10 reps - 5 hold           ---      ---   Total Time    Timed Minutes 43 minutes   Total Time 43 minutes        Assessment   Barba has treated in PT for 8 of 12 sessions currently and is having less incidence of HA.  Her AROM c/s Rot is also much improved.  Mild improvement noted in FOTO score.  She continues to have significant tenderness in her R UT, and continues to require some cues for postural awareness.  She did demonstrate ability to lift and carry 15 lbs in each hand today, but did have some discomfort on R side.   Plan   Continue with POC, continue to focus on proper lifting/carrying      Goals    Goal 1: Patient will demonstrate independence in prescribed HEP with proper form, sets and reps for safe discharge to an independent program.    Updated HEP 11/22 dp  01/06/20:  Compliance with exercises daily.    Sessions: 12      Goal 2: Improve FOTO score to predicted score of 59 from initial score of 48 for optimal outcomes.      12/23/19: Progressed to FOTO 52     Sessions: 11      Goal 3: Improve C/S AROM Rot to at least 70 degrees to improve safety with driving.     01/06/20:  AROM goal achieved at 75 to R and 72 to L.    Sessions: 12      Goal 4: Improve MMT B UE to at least 4+/5 all planes to improve lifting without increased pain.     01/06/20:  Strength goal achieved, but patient limited with lifting due to fear.     Sessions: 60                                Ralene Muskrat, DPT

## 2020-01-09 ENCOUNTER — Inpatient Hospital Stay: Payer: 59 | Attending: Internal Medicine

## 2020-01-09 DIAGNOSIS — M542 Cervicalgia: Secondary | ICD-10-CM | POA: Insufficient documentation

## 2020-01-09 NOTE — PT/OT Therapy Note (Signed)
Name: Vanessa Coleman Age: 39 y.o.   Date of Service: 01/09/2020  Referring Physician: Mauro Kaufmann, MD   Date of Injury: 10/02/2019  Date Care Plan Established/Reviewed: 11/28/2019  Date Treatment Started: 11/28/2019  End of Certification Date: 02/25/2020  Sessions in Plan of Care: 12  Surgery Date: No data was found    Visit Count: 9   Diagnosis:   1. Cervicalgia        Subjective     Pain   Current pain rating: 0  Patient states after last visit she was dizzy, and she was vomiting, says she was still not great yesterday. Today she feels better. Says she has less dizzy ness but her head feel heavy to her. Says she has had one episode before like this 1 year ago says she went to the ER they thought was vertigo but was not dx.     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      Precautions: No data was found  Allergies: Meloxicam and Penicillins  Initial Evaluation Reference and/or Current Measurements(as dated):      Outcome Measure   Tool Used/Details: FOTO (11)  Score: IE:  48   11/22:  52  Predicted Functional Outcome: 59         Strength      Left Hand IE                            01/06/20     Grip (2nd hand position)     Trial 1: 50                                40    Trial 2: 38                                35    Trial 3: 40                                36    Average: 42.67                       Avg:  37 lbs     Right Hand      Grip (2nd hand position)     Trial 1: 45                                40    Trial 2: 50                                45    Trial 3: 45                                47    Average: 46.67                       Avg:  44 lbs      AROM: Cervical Spine    Initial   12/6  Flexion 40   46     Extension 31    40       R L R L   Rotation 65 54  75 72   Side Bending 25 26  34  35   (blank fields were intentionally left blank)     Initial R 12/2   R UE Strength  MMT /5 Initial  L   12/2  L   3+  4+ Shoulder Flexion 3+ 4+    4  4+ Shoulder Abduction 4  4+   4+  4+ Shoulder Extension 4+  4+   4  4+ Shoulder External Rotation 4  4+   4+ 4+  Shoulder Internal Rotation 4+  4+   4+ 5  Biceps 4+ 5    5 5   Triceps 5 5    (blank fields were intentionally left blank)              Treatment     Therapeutic Exercises - Justified to address any of the following: develop strength, endurance, ROM and/or flexibility.   COMBO Bike,x 6 min with subjective to start today s session - deferred today due to in use    seated cervical rotation x 10 5 sec hold   upper trap stretch, scalenes stretch, seated 2 x 30 sec each    Added wall push up x 15 with cue to activate her scapula              Neuromuscular Re-Education - Justified to address any of the following: of movement, balance, coordination, kinesthetic sense, posture and/or proprioception for sitting and/or standing activities.   -scapular retraction x 20 with 3 sec hold with cues for proper form and to reduce UT compensation seated  - seated  H abd 2 x 10 with 3 sec hold with verbal cues to focus on scapular retraction with RTB-progressed band    RTB row, x 20 5 sec hold with cue to activate her scapula progressed rep   RTB  Ext x 20 with cue to activate her scapula progressed rep   red T band lats x 20 with cue for scapula activation progressed rep  Added red T band star x 5 ea added red T band B ER x 15        Not today   YTB walk up the wall with lift off x 10 3 sec hold    seated thoracic extension x 10 5 sec hold with 1/2 foam roll in chair behind thoracic  -in reach under and reach up x 5 5 ea   red T band star x  3 points x 5 ea      Manual Therapy - Justified to address any of the following:  Mobilization of joints and soft tissues, manipulation, manual lymphatic drainage, and/or manual traction.    -STM/DTM UT/CS PVM,  With cupping to R UT with C/S Rotation and SB    BP 126/89                       Therapeutic Activity - Justified to address the  following: activities to improve functional performance.  ..  -Jimmye Norman carry 2 x 50 feet with 2lb and 5 # ketterball in each hand  -    Home Exercises   Home Exercises   Access Code: [email or web address]  URL: [email or web address]  Date: 11/28/2019  Prepared by: Raynelle Fanning Rimel    Access Code: [email or web address]  URL: [email or web address]  Date: 12/23/2019  Prepared by: Cira Rue    Exercises  .Seated Passive Cervical Retraction - 5 x daily - 5 x weekly - 1 sets - 5 reps - 5 hold  .Seated Scapular Retraction - 5 x daily - 5 x weekly - 1 sets - 5 reps - 5 hold  .Sidelying Open Book Thoracic Lumbar Rotation and Extension - 2 x daily - 5 x weekly - 1 sets - 10 reps - 10 hold  .Doorway Pec Stretch at 90 Degrees Abduction - 2 x daily - 5 x weekly - 1 sets - 3 reps - 30 hold  .Standing Shoulder Row with Anchored Resistance - 1 x daily - 3 x weekly - 1 sets - 10 reps - 5 hold  .Shoulder extension with resistance - Neutral - 1 x daily - 3 x weekly - 1 sets - 10 reps - 5 hold  .Standing Shoulder External Rotation with Resistance - 1 x daily - 3 x weekly - 1 sets - 10 reps - 5 hold  .Standing Shoulder Horizontal Abduction with Resistance - 1 x daily - 3 x weekly - 1 sets - 10 reps - 5 hold  .Supine Scapular Protraction in Flexion with Dumbbells - 1 x daily - 3 x weekly - 1 sets - 10 reps - 5 hold  .Cat-Camel - 1 x daily - 3 x weekly - 1 sets - 10 reps - 5 hold           ---      ---   Total Time    Timed Minutes 45 minutes   Total Time 45 minutes        Assessment   Patient had no head ache and no episodes of dizzy ness today while performing the exercises. Patient reports after massage and cupping to her upper trap and neck her foggy eyes she reports went away and she felt much better. Patient needs cue with exercises to activate her scapula region and avoid upper trap compensation.   Plan   Continue with POC, suggested to patient to monitor her BP for  3 days and if the dizzy ness could be positional does not go away to  contact her MD      Goals    Goal 1: Patient will demonstrate independence in prescribed HEP with proper form, sets and reps for safe discharge to an independent program.    Updated HEP 11/22 dp  01/06/20:  Compliance with exercises daily.    Sessions: 12      Goal 2: Improve FOTO score to predicted score of 59 from initial score of 48 for optimal outcomes.      12/23/19: Progressed to FOTO 52     Sessions: 11      Goal 3: Improve C/S AROM Rot to at least 70 degrees to improve safety with driving.     01/06/20:  AROM goal achieved at 75 to R and 72 to L.    Sessions: 12      Goal 4: Improve MMT B UE to at least 4+/5 all planes to improve lifting without increased pain.     01/06/20:  Strength goal achieved, but patient limited with lifting due to fear.     Sessions: [address]                                [place] Lank, [place]

## 2020-01-14 ENCOUNTER — Inpatient Hospital Stay: Payer: 59 | Attending: Internal Medicine | Admitting: Rehabilitative and Restorative Service Providers"

## 2020-01-14 VITALS — BP 136/90 | HR 62

## 2020-01-14 DIAGNOSIS — M542 Cervicalgia: Secondary | ICD-10-CM | POA: Insufficient documentation

## 2020-01-14 NOTE — PT/OT Therapy Note (Signed)
Name: Vanessa Coleman Age: 39 y.o.   Date of Service: 01/14/2020  Referring Physician: Mauro Kaufmann, MD   Date of Injury: 10/02/2019  Date Care Plan Established/Reviewed: 11/28/2019  Date Treatment Started: 11/28/2019  End of Certification Date: 02/25/2020  Sessions in Plan of Care: 12  Surgery Date: No data was found    Visit Count: 10   Diagnosis:   1. 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  I still have a little discomfort in my upper back area. Maybe a 5/10.  I can tolerate it, but I know it is there.       Precautions: No data was found  Allergies: Meloxicam and Penicillins  Initial Evaluation Reference and/or Current Measurements(as dated):            Outcome Measure   Tool Used/Details: FOTO (11)  Score: IE:  48   11/22:  52  Predicted Functional Outcome: 59         Strength      Left Hand IE                            01/06/20     Grip (2nd hand position)     Trial 1: 50                                40    Trial 2: 38                                35    Trial 3: 40                                36    Average: 42.67                       Avg:  37 lbs     Right Hand      Grip (2nd hand position)     Trial 1: 45                                40    Trial 2: 50                                45    Trial 3: 45                                47    Average: 46.67                       Avg:  44 lbs      AROM: Cervical Spine    Initial   12/6      Flexion 40   46     Extension 31    40       R L R L  Rotation 65 54  75 72   Side Bending 25 26  34  35   (blank fields were intentionally left blank)     Initial R 12/2   R UE Strength  MMT /5 Initial  L  12/2  L   3+  4+ Shoulder Flexion 3+ 4+    4  4+ Shoulder Abduction 4  4+   4+  4+ Shoulder Extension 4+  4+   4  4+ Shoulder External Rotation 4  4+   4+ 4+  Shoulder Internal Rotation 4+  4+   4+ 5   Biceps 4+ 5    5 5   Triceps 5 5    (blank fields were intentionally left blank)       BP: 136/90 Heart Rate: 62    Treatment     Therapeutic Exercises - Justified to address any of the following: develop strength, endurance, ROM and/or flexibility.   COMBO Bike,x 6 min with subjective to start today s session - deferred today due to in use    seated cervical rotation x 10 5 sec hold   upper trap stretch, scalenes stretch, seated 2 x 30 sec each     - wall push up 2 x 10x 15 with cue to activate her scapula              Neuromuscular Re-Education - Justified to address any of the following: of movement, balance, coordination, kinesthetic sense, posture and/or proprioception for sitting and/or standing activities.   -scapular retraction x 20 with 3 sec hold with cues for proper form and to reduce UT compensation seated  -   H abd 2 x 10 with 3 sec hold with verbal cues to focus on scapular retraction with RTB-progressed band    RTB row, x 20 5 sec hold with cue to activate her scapula progressed rep   RTB  Ext x 20 with cue to activate her scapula progressed rep     -red T band B ER x 15        Not today   YTB walk up the wall with lift off x 10 3 sec hold    seated thoracic extension x 10 5 sec hold with 1/2 foam roll in chair behind thoracic  -in reach under and reach up x 5 5 ea   red T band star x  3 points x 5 ea   red T band lats x 20 with cue for scapula activation progressed rep       Manual Therapy - Justified to address any of the following:  Mobilization of joints and soft tissues, manipulation, manual lymphatic drainage, and/or manual traction.    -STM/DTM UT/CS PVM,  With cupping to R UT with C/S Rotation and       Assessed:  Diastolic pressure was 100 and then reduced to 90.  Systolic was in the 130's.                              Therapeutic Activity - Justified to address the following: activities to improve functional performance.  Deferred today with increased discomfort  -Jimmye Norman carry 2 x 50 feet  with 2lb and 5 # ketterball in each hand  -    Home Exercises   Home Exercises   Access Code: [email or web address]  URL: [email or web address]  Date: 11/28/2019  Prepared by: Raynelle Fanning Rimel    Access Code: [email or web address]  URL: [email or web address]  Date: 12/23/2019  Prepared by: Cira Rue    Exercises  .Seated Passive Cervical Retraction - 5 x daily - 5 x weekly - 1 sets - 5 reps - 5 hold  .Seated Scapular Retraction - 5 x daily - 5 x weekly - 1 sets - 5 reps - 5 hold  .Sidelying Open Book Thoracic Lumbar Rotation and Extension - 2 x daily - 5 x weekly - 1 sets - 10 reps - 10 hold  .Doorway Pec Stretch at 90 Degrees Abduction - 2 x daily - 5 x weekly - 1 sets - 3 reps - 30 hold  .Standing Shoulder Row with Anchored Resistance - 1 x daily - 3 x weekly - 1 sets - 10 reps - 5 hold  .Shoulder extension with resistance - Neutral - 1 x daily - 3 x weekly - 1 sets - 10 reps - 5 hold  .Standing Shoulder External Rotation with Resistance - 1 x daily - 3 x weekly - 1 sets - 10 reps - 5 hold  .Standing Shoulder Horizontal Abduction with Resistance - 1 x daily - 3 x weekly - 1 sets - 10 reps - 5 hold  .Supine Scapular Protraction in Flexion with Dumbbells - 1 x daily - 3 x weekly - 1 sets - 10 reps - 5 hold  .Cat-Camel - 1 x daily - 3 x weekly - 1 sets - 10 reps - 5 hold           ---      ---   Total Time    Timed Minutes 43 minutes   Total Time 43 minutes        Assessment   Kalani continues to demonstrate elevated BP, with diastolic pressure starting at 347 and reducing to 90.  Advised to follow up with MD, as diastolic levels exceed recommended and safe levels.  Continues to have tenderness in B UT that improves with cupping.  Improving form with scapular control with TBand exercises, but does require tactile cues to reduce UT compensation.    Plan   Continue with POC, encouraged patient to follow up with MD due to continued elevated BP.       Goals    Goal 1: Patient will demonstrate independence in prescribed HEP  with proper form, sets and reps for safe discharge to an independent program.    Updated HEP 11/22 dp  01/06/20:  Compliance with exercises daily.    Sessions: 12      Goal 2: Improve FOTO score to predicted score of 59 from initial score of 48 for optimal outcomes.      12/23/19: Progressed to FOTO 52     Sessions: 11      Goal 3: Improve C/S AROM Rot to at least 70 degrees to improve safety with driving.     01/06/20:  AROM goal achieved at 75 to R and 72 to L.    Sessions: 12      Goal 4: Improve MMT B UE to at least 4+/5 all planes to improve lifting without increased pain.     01/06/20:  Strength goal achieved, but patient limited with lifting due to fear.     Sessions: 65                                Ralene Muskrat, DPT

## 2020-01-17 ENCOUNTER — Inpatient Hospital Stay: Payer: 59 | Attending: Internal Medicine

## 2020-01-17 DIAGNOSIS — M542 Cervicalgia: Secondary | ICD-10-CM | POA: Insufficient documentation

## 2020-01-17 NOTE — PT/OT Therapy Note (Signed)
Name: Vanessa Coleman Age: 39 y.o.   Date of Service: 01/17/2020  Referring Physician: Mauro Kaufmann, MD   Date of Injury: 10/02/2019  Date Care Plan Established/Reviewed: 11/28/2019  Date Treatment Started: 11/28/2019  End of Certification Date: 02/25/2020  Sessions in Plan of Care: 12  Surgery Date: No data was found    Visit Count: 8   Diagnosis:   1. 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 Pt states she is performing at least 5-10 min of her HEP daily in her busy schedule. Saw doctor and has been monitoring and controlling her BP within normal levels.      Precautions: depression/anxiety  Allergies: Meloxicam and Penicillins               Initial Evaluation Reference and/or Current Measurements(as dated):            Outcome Measure   Tool Used/Details: FOTO (11)  Score: IE:  48   11/22:  52  Predicted Functional Outcome: 59         Strength      Left Hand IE                            01/06/20     Grip (2nd hand position)     Trial 1: 50                                40    Trial 2: 38                                35    Trial 3: 40                                36    Average: 42.67                       Avg:  37 lbs     Right Hand      Grip (2nd hand position)     Trial 1: 45                                40    Trial 2: 50                                45    Trial 3: 45                                47    Average: 46.67                       Avg:  44 lbs      AROM: Cervical Spine    Initial  12/6      Flexion 40   46     Extension 31    40       R L R L   Rotation 65 54  75 72   Side Bending 25 26  34  35   (blank fields were intentionally left blank)     Initial R 12/2   R UE Strength  MMT /5 Initial  L  12/2  L   3+  4+ Shoulder Flexion 3+  4+    4  4+ Shoulder Abduction 4  4+   4+  4+ Shoulder Extension 4+  4+   4  4+ Shoulder External Rotation 4  4+   4+ 4+  Shoulder Internal Rotation 4+  4+   4+ 5  Biceps 4+ 5    5 5   Triceps 5 5    (blank fields were intentionally left blank)    Treatment     Therapeutic Exercises - Justified to address any of the following: develop strength, endurance, ROM and/or flexibility.   COMBO Bike,x 6 min with subjective to start today 'session -     -Passive neck stretching in supine    seated cervical rotation x 10 5 sec hold   upper trap stretch, scalenes stretch, seated 2 x 30 sec each     - wall push up with cue to activate her scapula and decrease UT elevation    -Added DWS stretch 2 ways 2 x 20" ea    -Added standing UT/LE stretch      Neuromuscular Re-Education - Justified to address any of the following: of movement, balance, coordination, kinesthetic sense, posture and/or proprioception for sitting and/or standing activities.   -scapular retraction x 20 with 3 sec hold with cues for proper form and to reduce UT compensation seated-NOT today    -   H abd 2 x 10 with 3 sec hold with verbal cues to focus on scapular retraction and decreased UT elevation.     RTB row, x 20 5 sec hold with cue to activate her scapula progressed rep     RTB  Ext x 20 with cue to activate her scapula progressed rep     -red T band B ER x 20 (increased reps)    - Added Reverse wall PU's x 20 with cues to engage scapular muscles        Not today   YTB walk up the wall with lift off x 10 3 sec hold    seated thoracic extension x 10 5 sec hold with 1/2 foam roll in chair behind thoracic  -in reach under and reach up x 5 5 ea   red T band star x  3 points x 5 ea   red T band lats x 20 with cue for scapula activation progressed rep       Manual Therapy - Justified to address any of the following:  Mobilization of joints and soft tissues, manipulation, manual lymphatic drainage, and/or manual traction.    -STM/DTM UT/CS PVM,  With cupping to R  UT with C/S Rotation-No cupping today    -Sub Occipital release    -Pt instructed in self sub-occipital release with balls in sock.    Therapeutic Activity - Justified to address the following: activities to improve functional performance.  Deferred today   -Jimmye Norman carry 2 x 50 feet with 2lb and 5 # ketterball in each hand      Home Exercises  Home Exercises   Access Code: [email or web address]  URL: [email or web address]  Date: 11/28/2019  Prepared by: Raynelle Fanning Rimel    Access Code: [email or web address]  URL: [email or web address]  Date: 12/23/2019  Prepared by: Cira Rue    Exercises  .Seated Passive Cervical Retraction - 5 x daily - 5 x weekly - 1 sets - 5 reps - 5 hold  .Seated Scapular Retraction - 5 x daily - 5 x weekly - 1 sets - 5 reps - 5 hold  .Sidelying Open Book Thoracic Lumbar Rotation and Extension - 2 x daily - 5 x weekly - 1 sets - 10 reps - 10 hold  .Doorway Pec Stretch at 90 Degrees Abduction - 2 x daily - 5 x weekly - 1 sets - 3 reps - 30 hold  .Standing Shoulder Row with Anchored Resistance - 1 x daily - 3 x weekly - 1 sets - 10 reps - 5 hold  .Shoulder extension with resistance - Neutral - 1 x daily - 3 x weekly - 1 sets - 10 reps - 5 hold  .Standing Shoulder External Rotation with Resistance - 1 x daily - 3 x weekly - 1 sets - 10 reps - 5 hold  .Standing Shoulder Horizontal Abduction with Resistance - 1 x daily - 3 x weekly - 1 sets - 10 reps - 5 hold  .Supine Scapular Protraction in Flexion with Dumbbells - 1 x daily - 3 x weekly - 1 sets - 10 reps - 5 hold  .Cat-Camel - 1 x daily - 3 x weekly - 1 sets - 10 reps - 5 hold           ---      ---   Total Time    Timed Minutes 45 minutes   Total Time 45 minutes        Assessment   Patient reports decreased pain after session, stating she feels much "looser." She reports muscle tension during all activities performed, but denies any other negative Sx's. Patient was most challenged with reverse wall push ups due to weakness. SIgnificant UT/LS muscle  guarding noted throughout session, needing mod cueing to relax shoulders.  Plan   Continue POC.  Resume thread the needle stretch next session.      Goals    Goal 1: Patient will demonstrate independence in prescribed HEP with proper form, sets and reps for safe discharge to an independent program.    09/11/19: Patient states compliance with HEP.   Sessions: 16      Goal 2: Increase active cervical rotation to 60 degrees for increased visual field to allow safe changing lanes and backing up vehicle while driving.    1/47/82: Patient's cervical rotation AROM 60 degrees or more bilaterally. Patient states she still has to rotate her trunk to see over shoulder when she is changing lanes while driving. MM   Sessions: 16      Goal 3: Increase mid-trap/lower trap strength 5/5 for maintenance of proper standing posture needed to stand and walk for >1 hour.   Sessions: 16      Goal 4: Patient able to demonstrate correct body mechanics and proper back position during sitting and lifting activities to allow return to household activities with pain <3/10.   Sessions: 16          Goal 5: Demonstrate proper posture and body mechanics with sleeping position to allow patient to sleep undisturbed > 6 hours.    09/23/19: Patient waking about 2 times per night secondary  to tightness / stiffness, stretching ^ returning to sleep. MM   Sessions: 16      Goal 6: Improve FOTO score to 56 points by visit 12 to improve patient's quality of life.       Sessions: 16                  Goals    Goal 1: Patient will demonstrate independence in prescribed HEP with proper form, sets and reps for safe discharge to an independent program.    Updated HEP 11/22 dp  01/06/20:  Compliance with exercises daily.    Sessions: 12      Goal 2: Improve FOTO score to predicted score of 59 from initial score of 48 for optimal outcomes.      12/23/19: Progressed to FOTO 52     Sessions: 11      Goal 3: Improve C/S AROM Rot to at least 70 degrees to improve safety  with driving.     01/06/20:  AROM goal achieved at 75 to R and 72 to L.    Sessions: 12      Goal 4: Improve MMT B UE to at least 4+/5 all planes to improve lifting without increased pain.     01/06/20:  Strength goal achieved, but patient limited with lifting due to fear.     Sessions: 12                                Miguel Dibble, LPTA

## 2020-01-20 ENCOUNTER — Inpatient Hospital Stay: Payer: 59 | Attending: Internal Medicine | Admitting: Rehabilitative and Restorative Service Providers"

## 2020-01-20 DIAGNOSIS — M542 Cervicalgia: Secondary | ICD-10-CM | POA: Insufficient documentation

## 2020-01-20 NOTE — PT/OT Therapy Note (Signed)
Name: Vanessa Coleman Age: 39 y.o.   Date of Service: 01/20/2020  Referring Physician: Mauro Kaufmann, MD   Date of Injury: 10/02/2019  Date Care Plan Established/Reviewed: 11/28/2019  Date Treatment Started: 11/28/2019  End of Certification Date: 02/25/2020  Sessions in Plan of Care: 3  Surgery Date: No data was found    Visit Count: 12   Diagnosis:   1. 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  When I wake up, I have neck and shoulder pain. Right now, I don't have pain.  Just tightness.  I do use the balls to help with SOR, and it does help with HA. I have seen a lot of improvement, but I feel like I need a little more therapy.  I feel about 70% better at this point.  I still have knot in R shoulder that causes HA and pain.       Precautions: No data was found  Allergies: Meloxicam and Penicillins                     Initial Evaluation Reference and/or Current Measurements(as dated):      Functional Limitations (PLOF):IE:  With driving, hard to turn to look over shoulder, R > L 01/20/20: Much better.  No issues ( no issues with driving); IE:  with increased activity and movement, increased incidence of HA that last for few hours and take medicine to control  01/20/20:  Don't need to take medicine now for HA unless really severe.  Did have to take meds last week. (HA have changed from front sinus to back of head now); IE: unable to carry 2 gallons of water in one hand 01/20/20:  Able to lift 2 gallons on right side without issue (no issues with carrying gallons of water); IE:  increased neck and upper back pain with walking for 45 min 01/20/20:  Able to do one day a week of 45 min, but then only able to do 30 min for other walks due to fatigue  (able to walk for an hour for exercise without issue);        Outcome Measure   Tool Used/Details:  FOTO (11)  Score: IE:  48   11/22:  52   01/20/20: 67  Predicted Functional Outcome: 59         Strength      Left Hand IE                            01/06/20  12/20     Grip (2nd hand position)     Trial 1: 50                                40  42    Trial 2: 38                                35  41    Trial 3: 40  36  43    Average: 42.67                       Avg:  37 lbs Avg:  42 lbs     Right Hand      Grip (2nd hand position)     Trial 1: 45                                40  60    Trial 2: 50                                45  55    Trial 3: 45                                47  60    Average: 46.67                       Avg:  44 lbs Avg:  59 lbs      AROM: Cervical Spine    Initial   12/6    12/20    Flexion 40   46   46    Extension 31    40   34      R L R L R L   Rotation 65 54  75 72 75 75   Side Bending 25 26  34  35 30 34   (blank fields were intentionally left blank)     Initial R 12/2   R UE Strength  MMT /5 Initial  L  12/2  L   3+  4+ Shoulder Flexion 3+ 4+    4  4+ Shoulder Abduction 4  4+   4+  4+ Shoulder Extension 4+  4+   4  4+ Shoulder External Rotation 4  4+   4+ 4+  Shoulder Internal Rotation 4+  4+   4+ 5  Biceps 4+ 5    5 5   Triceps 5 5    (blank fields were intentionally left blank)    Treatment     Therapeutic Exercises - Justified to address any of the following: develop strength, endurance, ROM and/or flexibility.   COMBO Bike,x 6 min with subjective to start today 'session -     -Passive neck stretching in supine    -Open Book x 5 each side    Deferred due to to time:  seated cervical rotation x 10 5 sec hold   upper trap stretch, scalenes stretch, seated 2 x 30 sec each     - wall push up with cue to activate her scapula and decrease UT elevation    -Added DWS stretch 2 ways 2 x 20" ea    -Added standing UT/LE stretch      Neuromuscular Re-Education - Justified to address any of the following: of movement, balance, coordination, kinesthetic sense,  posture and/or proprioception for sitting and/or standing activities.   -deferred due to time:  -scapular retraction x 20 with 3 sec hold with cues for proper form and to reduce UT compensation seated-NOT today    -   H abd 2 x 10 with 3 sec hold with verbal cues to focus on scapular retraction and decreased UT elevation.  RTB row, x 20 5 sec hold with cue to activate her scapula progressed rep     RTB  Ext x 20 with cue to activate her scapula progressed rep     -red T band B ER x 20 (increased reps)    - Added Reverse wall PU's x 20 with cues to engage scapular muscles        Not today   YTB walk up the wall with lift off x 10 3 sec hold    seated thoracic extension x 10 5 sec hold with 1/2 foam roll in chair behind thoracic  -in reach under and reach up x 5 5 ea   red T band star x  3 points x 5 ea   red T band lats x 20 with cue for scapula activation progressed rep       Manual Therapy - Justified to address any of the following:  Mobilization of joints and soft tissues, manipulation, manual lymphatic drainage, and/or manual traction.    -STM/DTM UT/CS PVM,       -Sub Occipital release    -REAssess    -Trial McConnell Tape for UT inhibition, posture         Therapeutic Activity - Justified to address the following: activities to improve functional performance.  -review of proper sleeping position.  Patient prefers to sleep SL, but if on left side, increased pain on right.  Reviewed use of pillows to support arm.  Also reviewed proper head position.      today   -Farmer carry 2 x 50 feet with 2lb and 5 # ketterball in each hand      Home Exercises   Home Exercises   Access Code: [email or web address]  URL: [email or web address]  Date: 11/28/2019  Prepared by: Raynelle Fanning Rimel    Access Code: [email or web address]  URL: [email or web address]  Date: 12/23/2019  Prepared by: Cira Rue    Exercises  .Seated Passive Cervical Retraction - 5 x daily - 5 x weekly - 1 sets - 5 reps - 5 hold  .Seated Scapular Retraction - 5  x daily - 5 x weekly - 1 sets - 5 reps - 5 hold  .Sidelying Open Book Thoracic Lumbar Rotation and Extension - 2 x daily - 5 x weekly - 1 sets - 10 reps - 10 hold  .Doorway Pec Stretch at 90 Degrees Abduction - 2 x daily - 5 x weekly - 1 sets - 3 reps - 30 hold  .Standing Shoulder Row with Anchored Resistance - 1 x daily - 3 x weekly - 1 sets - 10 reps - 5 hold  .Shoulder extension with resistance - Neutral - 1 x daily - 3 x weekly - 1 sets - 10 reps - 5 hold  .Standing Shoulder External Rotation with Resistance - 1 x daily - 3 x weekly - 1 sets - 10 reps - 5 hold  .Standing Shoulder Horizontal Abduction with Resistance - 1 x daily - 3 x weekly - 1 sets - 10 reps - 5 hold  .Supine Scapular Protraction in Flexion with Dumbbells - 1 x daily - 3 x weekly - 1 sets - 10 reps - 5 hold  .Cat-Camel - 1 x daily - 3 x weekly - 1 sets - 10 reps - 5 hold           ---      ---   Total Time    Timed Minutes 48 minutes   Total Time 48 minutes  Assessment   Islay has been treated in PT for 12 sessions.  Overall, she reports about 70% improvement, but continues to have signficant challenge with R UT restriction, and challenge with sleeping.  Reviewed proper sleeping positions today, with use of pillows to improve support in SL position.  Recommend continue with PT for 1-2 sessions to ensure understanding of proper sleeping, assess tape to reduce UT restriction.    Plan   Continue POC.  Assess tape next session.  Progress and review HEP.  Resume thread the needle stretch next session.      Goals    Goal 1: Patient will demonstrate independence in prescribed HEP with proper form, sets and reps for safe discharge to an independent program.    Updated HEP 11/22 dp  01/06/20:  Compliance with exercises daily.     01/20/20:  Patient reports compliance daily with current HEP.    Sessions: 14      Goal 2: Improve FOTO score to predicted score of 59 from initial score of 48 for optimal outcomes.      12/23/19: Progressed to FOTO 52       01/20/20:  FOTO score exceeded at 67.   Sessions: 11      Goal 3: Improve C/S AROM Rot to at least 70 degrees to improve safety with driving.     01/06/20:  AROM goal achieved at 75 to R and 72 to L.    Sessions: 12      Goal 4: Improve MMT B UE to at least 4+/5 all planes to improve lifting without increased pain.     01/06/20:  Strength goal achieved, but patient limited with lifting due to Coleman.      01/20/20:  Able to carry 2 gallons of water in 1 hand without issues.     Sessions: 12          Goal 5: Demonstrate proper posture and body mechanics with sleeping position to allow patient to sleep undisturbed > 5 hours.   Sessions: 3                         Ralene Muskrat, DPT

## 2020-01-20 NOTE — Progress Notes (Signed)
Name:Vanessa Coleman Age: 39 y.o.   Date of Service: 01/20/2020  Referring Physician: Mauro Kaufmann, MD   Date of Injury: 10/02/2019  Date Care Plan Established/Reviewed: 11/28/2019  Date Treatment Started: 11/28/2019  End of Certification Date: 02/25/2020  Sessions in Plan of Care: 3  Surgery Date: No data was found      Visit Count: 12   Diagnosis:   1. 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  When I wake up, I have neck and shoulder pain. Right now, I don't have pain.  Just tightness.  I do use the balls to help with SOR, and it does help with HA. I have seen a lot of improvement, but I feel like I need a little more therapy.  I feel about 70% better at this point.  I still have knot in R shoulder that causes HA and pain.       Initial Evaluation Reference and/or Current Measurements(as dated):       Outcome Measure   Tool Used/Details: FOTO (11)  Score: IE:  48   11/22:  52   01/20/20: 67  Predicted Functional Outcome: 59         Strength      Left Hand IE                            01/06/20                 12/20     Grip (2nd hand position)     Trial 1: 50                                40                42    Trial 2: 38                                35                41    Trial 3: 40                                36                43    Average: 42.67                       Avg:  37 lbs       Avg:  42 lbs     Right Hand      Grip (2nd hand position)     Trial 1: 45                                40                60    Trial 2: 50  45                55    Trial 3: 45                                47                60    Average: 46.67                       Avg:  44 lbs       Avg:  59 lbs      AROM: Cervical Spine    Initial   12/6    12/20     Flexion 40   46   46     Extension 31    40   34       R L R  L R L   Rotation 65 54  75 72 75 75   Side Bending 25 26  34  35 30 34   (blank fields were intentionally left blank)     Initial R 12/2   R UE Strength  MMT /5 Initial  L  12/2  L   3+  4+ Shoulder Flexion 3+ 4+    4  4+ Shoulder Abduction 4  4+   4+  4+ Shoulder Extension 4+  4+   4  4+ Shoulder External Rotation 4  4+   4+ 4+  Shoulder Internal Rotation 4+  4+   4+ 5  Biceps 4+ 5    5 5   Triceps 5 5    (blank fields were intentionally left blank)             Precautions: No data was found  Allergies: Meloxicam and Penicillins    Past Medical History:   Diagnosis Date   . Anemia    . Arthritis    . Depression    . Headache                          ---      ---   Total Time    Timed Minutes 48 minutes   Total Time 48 minutes        Assessment   Haliegh has been treated in PT for 12 sessions.  Overall, she reports about 70% improvement, but continues to have signficant challenge with R UT restriction, and challenge with sleeping.  Reviewed proper sleeping positions today, with use of pillows to improve support in SL position.  Recommend continue with PT for 1-2 sessions to ensure understanding of proper sleeping, assess tape to reduce UT restriction.       Functional Limitations (PLOF):IE:  With driving, hard to turn to look over shoulder, R > L 01/20/20: Much better.  No issues ( no issues with driving); IE:  with increased activity and movement, increased incidence of HA that last for few hours and take medicine to control  01/20/20:  Don't need to take medicine now for HA unless really severe.  Did have to take meds last week. (HA have changed from front sinus to back of head now); IE: unable to carry 2 gallons of water in one hand 01/20/20:  Able to lift 2 gallons on right side without issue (no issues with carrying gallons of water); IE:  increased neck and upper back pain  with walking for 45 min 01/20/20:  Able to do one day a week of 45 min, but then only able to do 30 min for other walks due to fatigue  (able  to walk for an hour for exercise without issue);      Pt would benefit from continued care to achieve goals.  Plan   Visits per week: 1  Number of Sessions: 3  Direct One on One  16109: Therapeutic Exercise: To Develop Strength and Endurance, ROM and Flexibility  [id number]: Neuromuscular Reeducation  97140: Manual Therapy techniques (mobilization, manipulation, manual traction)  97530: Therapeutic Activities: Dynamic activities to improve functional performance  Dry Needling  Supervised Modalities  97010: Thermal modalities: hot/cold packs  60454: Electrical stimulation      Recommendations:   Extend Original "Plan of Care" to address the above impairments / functional limitations.  Justification for extension: New Goals made      Goals    Goal 1: Patient will demonstrate independence in prescribed HEP with proper form, sets and reps for safe discharge to an independent program.    Updated HEP 11/22 dp  01/06/20:  Compliance with exercises daily.     01/20/20:  Patient reports compliance daily with current HEP.    Sessions: 14      Goal 2: Improve FOTO score to predicted score of 59 from initial score of 48 for optimal outcomes.      12/23/19: Progressed to FOTO 52      01/20/20:  FOTO score exceeded at 67.   Sessions: 11      Goal 3: Improve C/S AROM Rot to at least 70 degrees to improve safety with driving.     01/06/20:  AROM goal achieved at 75 to R and 72 to L.    Sessions: 12      Goal 4: Improve MMT B UE to at least 4+/5 all planes to improve lifting without increased pain.     01/06/20:  Strength goal achieved, but patient limited with lifting due to fear.      01/20/20:  Able to carry 2 gallons of water in 1 hand without issues.     Sessions: 12          Goal 5: Demonstrate proper posture and body mechanics with sleeping position to allow patient to sleep undisturbed > 5 hours.   Sessions: 3                         Ralene Muskrat, DPT

## 2020-01-22 ENCOUNTER — Inpatient Hospital Stay: Payer: 59 | Attending: Internal Medicine

## 2020-01-22 DIAGNOSIS — M542 Cervicalgia: Secondary | ICD-10-CM | POA: Insufficient documentation

## 2020-01-22 NOTE — PT/OT Therapy Note (Addendum)
Name: Arayah Krouse Age: 39 y.o.   Date of Service: 01/22/2020  Referring Physician: Mauro Kaufmann, MD   Date of Injury: 10/02/2019  Date Care Plan Established/Reviewed: 11/28/2019  Date Treatment Started: 11/28/2019  End of Certification Date: 02/25/2020  Sessions in Plan of Care: 3  Surgery Date: No data was found    Visit Count: 13   Diagnosis:   1. Cervicalgia      Discontinuation of Therapy Services    Kadeja Bonzo did not complete prescribed physical therapy visits.      Status is unknown at this time, physical therapy has been discontinued and patient has been discharged from care.  The last therapy note is below for review.    Please feel free to contact me with any questions regarding the care of [place].    Sincerely,    Denton Lank, LPTA        02/23/2022          Subjective     Pain   Current pain rating: 8  Patient states she drove a car for 2 hours and she was tense and her neck is hurting that she had to take medication last night and this am. Patient states she has pain more in the right upper trap and into the right ear.   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      Precautions: No data was found  Allergies: Meloxicam and Penicillins    Objective                 Outcome Measure   Tool Used/Details: FOTO (11)  Score: IE:  48   11/22:  52   01/20/20: 67  Predicted Functional Outcome: 59         Strength      Left Hand IE                            01/06/20                 12/20     Grip (2nd hand position)     Trial 1: 50                                40                42    Trial 2: 38                                35                41    Trial 3: 40                                36                43    Average: 42.67                       Avg:  37 lbs       Avg:  42  lbs     Right Hand      Grip (2nd hand position)     Trial 1: 45                                 40                60    Trial 2: 50                                45                55    Trial 3: 45                                47                60    Average: 46.67                       Avg:  44 lbs       Avg:  59 lbs      AROM: Cervical Spine    Initial   12/6    12/20     Flexion 40   46   46     Extension 31    40   34       R L R L R L   Rotation 65 54  75 72 75 75   Side Bending 25 26  34  35 30 34   (blank fields were intentionally left blank)     Initial R 12/2   R UE Strength  MMT /5 Initial  L  12/2  L   3+  4+ Shoulder Flexion 3+ 4+    4  4+ Shoulder Abduction 4  4+   4+  4+ Shoulder Extension 4+  4+   4  4+ Shoulder External Rotation 4  4+   4+ 4+  Shoulder Internal Rotation 4+  4+   4+ 5  Biceps 4+ 5    5 5   Triceps 5 5    (blank fields were intentionally left blank)              Treatment     Therapeutic Exercises - Justified to address any of the following:  To develop strength, endurance, ROM and/or flexibility.   COMBO Bike,x 6 min with subjective to start today 'session -     -Passive neck stretching in supine    -Open Book x 10 each side progressed reps       upper trap stretch, scalenes stretch, supine 2 x 30 sec eac    - DWS stretch 2 ways 2 x 20" ea    Not today   - wall push up with cue to activate her scapula and decrease UT elevation    Neuromuscular Re-Education - Justified to address any of the following:   Re-education of movement, balance, coordination, kinesthetic sense, posture and/or proprioception for sitting and/or standing activities.   -added  supine YTB H abd x 15 3 sec hold       YTB row, x 20 5 sec hold with cue to activate her scapula progressed rep     YTB  Ext x 20 with  cue to activate her scapula progressed rep     Y T band B ER x 15  Cue for scapula squeeze      Not today  - Reverse wall PU's x 20 with cues to engage scapular muscles        Not today   YTB walk up the wall with lift off x 10 3 sec hold    seated thoracic extension x 10 5  sec hold with 1/2 foam roll in chair behind thoracic  -in reach under and reach up x 5 5 ea   red T band star x  3 points x 5 ea   red T band lats x 20 with cue for scapula activation progressed rep       Manual Therapy - Justified to address any of the following:    Mobilization of joints and soft tissues, manipulation, manual lymphatic drainage, and/or manual traction.    -STM/DTM UT/CS PVM,       -Sub Occipital release x 2 30 sec hold    Removed tape  12/22    -             Home Exercises   Home Exercises   Access Code: [email or web address]  URL: [email or web address]  Date: 11/28/2019  Prepared by: Raynelle Fanning Rimel      Access Code: [email or web address]  URL: [email or web address]  Date: 12/23/2019  Prepared by: Cira Rue    Exercises  Seated Passive Cervical Retraction - 5 x daily - 5 x weekly - 1 sets - 5 reps - 5 hold  Seated Scapular Retraction - 5 x daily - 5 x weekly - 1 sets - 5 reps - 5 hold  Sidelying Open Book Thoracic Lumbar Rotation and Extension - 2 x daily - 5 x weekly - 1 sets - 10 reps - 10 hold  Doorway Pec Stretch at 90 Degrees Abduction - 2 x daily - 5 x weekly - 1 sets - 3 reps - 30 hold  Standing Shoulder Row with Anchored Resistance - 1 x daily - 3 x weekly - 1 sets - 10 reps - 5 hold  Shoulder extension with resistance - Neutral - 1 x daily - 3 x weekly - 1 sets - 10 reps - 5 hold  Standing Shoulder External Rotation with Resistance - 1 x daily - 3 x weekly - 1 sets - 10 reps - 5 hold  Standing Shoulder Horizontal Abduction with Resistance - 1 x daily - 3 x weekly - 1 sets - 10 reps - 5 hold  Supine Scapular Protraction in Flexion with Dumbbells - 1 x daily - 3 x weekly - 1 sets - 10 reps - 5 hold  Cat-Camel - 1 x daily - 3 x weekly - 1 sets - 10 reps - 5 hold        Modalities   Ice Pack 10 min. Location right cervical region Position Recumbant  Therapy Rationale: Decrease Pain, Decrease Inflammation and Increase Extensiblility         Assessment   Patient having increase pain today kept  exercises lightly today reduced T band back to yellow. Patient has some tissue tightness son the right upper trap, scalenes more than the left with massage. Removed tape to see f this helps reduce pain. Patient level at end of session went from a 8/10 to 4/10  Plan   Continue with POC, return back to red T band and farmers carry if pain is low  Goals      Goal 1: Patient will demonstrate independence in prescribed HEP with proper form, sets and reps for safe discharge to an independent program.    Updated HEP 11/22 dp  01/06/20:  Compliance with exercises daily.     01/20/20:  Patient reports compliance daily with current HEP.    Sessions: 14      Goal 2: Improve FOTO score to predicted score of 59 from initial score of 48 for optimal outcomes.      12/23/19: Progressed to FOTO 52      01/20/20:  FOTO score exceeded at 67.   Sessions: 11      Goal 3: Improve C/S AROM Rot to at least 70 degrees to improve safety with driving.     01/06/20:  AROM goal achieved at 75 to R and 72 to L.    Sessions: 12      Goal 4: Improve MMT B UE to at least 4+/5 all planes to improve lifting without increased pain.     01/06/20:  Strength goal achieved, but patient limited with lifting due to fear.      01/20/20:  Able to carry 2 gallons of water in 1 hand without issues.     Sessions: 12          Goal 5: Demonstrate proper posture and body mechanics with sleeping position to allow patient to sleep undisturbed > 5 hours.   Sessions: 3                           Denton Lank, LPTA

## 2020-01-27 ENCOUNTER — Telehealth: Payer: Self-pay

## 2020-01-27 ENCOUNTER — Inpatient Hospital Stay: Payer: 59

## 2020-02-21 ENCOUNTER — Other Ambulatory Visit: Payer: Self-pay | Admitting: Otolaryngic Allergy

## 2020-02-21 DIAGNOSIS — J324 Chronic pansinusitis: Secondary | ICD-10-CM

## 2020-02-21 DIAGNOSIS — Z9889 Other specified postprocedural states: Secondary | ICD-10-CM

## 2020-03-02 ENCOUNTER — Other Ambulatory Visit: Payer: Self-pay | Admitting: Otolaryngic Allergy

## 2020-03-02 ENCOUNTER — Ambulatory Visit
Admission: RE | Admit: 2020-03-02 | Discharge: 2020-03-02 | Disposition: A | Discharge status: Home / Self Care | Payer: 59 | Source: Ambulatory Visit | Attending: Otolaryngic Allergy | Admitting: Otolaryngic Allergy

## 2020-03-02 DIAGNOSIS — J324 Chronic pansinusitis: Secondary | ICD-10-CM | POA: Insufficient documentation

## 2020-03-02 DIAGNOSIS — Z9889 Other specified postprocedural states: Secondary | ICD-10-CM | POA: Insufficient documentation

## 2020-07-28 NOTE — Progress Notes (Signed)
Name:Vanessa Coleman Age: 40 y.o.   Date of Service: 01/22/2020  Referring Physician: Mauro Kaufmann, MD   Date of Injury: 10/02/2019  Date Care Plan Established/Reviewed: 11/28/2019  Date Treatment Started: 11/28/2019  End of Certification Date: 02/25/2020  Sessions in Plan of Care: 3  Surgery Date: No data was found  MD Follow-up: No data was found  Medbridge Code: No data was found    Visit Count: 13   Diagnosis:   1. Cervicalgia    Discontinuation of Therapy Services    Vanessa Coleman did not complete prescribed physical therapy visits.  Please see last PR as [place] Summary.     Status is unknown at this time, physical therapy has been discontinued and patient has been discharged from care.  The last therapy note is below for review.    Please feel free to contact me with any questions regarding the care of [place].    Sincerely,    Ralene Muskrat, DPT        07/28/2020               Precautions: No data was found  Allergies: Meloxicam and Penicillins    Past Medical History:   Diagnosis Date    Anemia     Arthritis     Depression     Headache                         ---      Flowsheet Row ---   Total Time    Timed Minutes 40 minutes   Total Time 40 minutes             Goals      Goal 1: Patient will demonstrate independence in prescribed HEP with proper form, sets and reps for safe discharge to an independent program.    Updated HEP 11/22 dp  01/06/20:  Compliance with exercises daily.     01/20/20:  Patient reports compliance daily with current HEP.    Sessions: 14      Goal 2: Improve FOTO score to predicted score of 59 from initial score of 48 for optimal outcomes.      12/23/19: Progressed to FOTO 52      01/20/20:  FOTO score exceeded at 67.   Sessions: 11      Goal 3: Improve C/S AROM Rot to at least 70 degrees to improve safety with driving.     01/06/20:  AROM goal achieved at 75 to R and 72 to L.    Sessions: 12      Goal 4: Improve MMT B UE to at least 4+/5 all planes to improve lifting without increased  pain.     01/06/20:  Strength goal achieved, but patient limited with lifting due to fear.      01/20/20:  Able to carry 2 gallons of water in 1 hand without issues.     Sessions: 12          Goal 5: Demonstrate proper posture and body mechanics with sleeping position to allow patient to sleep undisturbed > 5 hours.   Sessions: 3                           Ralene Muskrat, DPT

## 2020-12-01 ENCOUNTER — Ambulatory Visit (INDEPENDENT_AMBULATORY_CARE_PROVIDER_SITE_OTHER): Payer: 59 | Admitting: Cardiovascular Disease

## 2020-12-01 ENCOUNTER — Other Ambulatory Visit: Payer: Self-pay | Admitting: Anesthesiology

## 2020-12-01 ENCOUNTER — Encounter (INDEPENDENT_AMBULATORY_CARE_PROVIDER_SITE_OTHER): Payer: Self-pay | Admitting: Cardiovascular Disease

## 2020-12-01 VITALS — BP 122/80 | HR 59 | Temp 98.0°F | Ht 66.0 in | Wt 153.0 lb

## 2020-12-01 DIAGNOSIS — M5412 Radiculopathy, cervical region: Secondary | ICD-10-CM

## 2020-12-01 DIAGNOSIS — R079 Chest pain, unspecified: Secondary | ICD-10-CM

## 2020-12-01 NOTE — Progress Notes (Signed)
Cardiology Consult note  Cardiology Consult    Date/Time:  12/01/2020 10:15 AM  Patient Name:  Vanessa Coleman  DoB:  [birth year]  MRN:  [id number]  Room#: Room/bed info not found    Chief Complaint:    Chief Complaint   Patient presents with    Chest Pain    Left side discomfort        Reason For Consult:   CP per Dr. Ned Grace    HPI :   40 yo with CP. L sided. Can involve L arm. Tightness. NOT  exertional. Can happen after exercise. Not pleuritic or positional. S/p hysterectomy with preserved L ovary. BP can be elevated on diastolic but not on meds. Has been on effexor for 3 years.         PMHx:     Past Medical History:   Diagnosis Date    Anemia     Arthritis     Depression     Headache        Allergies:     Allergies   Allergen Reactions    Meloxicam     Penicillins        Home Medications:     Current Outpatient Medications on File Prior to Visit   Medication Sig Dispense Refill    fluticasone (FLONASE) 50 MCG/ACT nasal spray as needed      ibuprofen (ADVIL) 800 MG tablet Take 800 mg by mouth as needed      Multiple Vitamins-Minerals (Oncovite) Tab Take 1 tablet by mouth      omeprazole (PriLOSEC) 40 MG capsule as needed      venlafaxine (EFFEXOR-XR) 37.5 MG 24 hr capsule Take 37.5 mg by mouth daily      cholecalciferol (vitamin D3) 25 MCG (1000 UT) tablet Take 1,000 Units by mouth      imipramine (TOFRANIL) 25 MG tablet       meclizine (ANTIVERT) 12.5 MG tablet TAKE 1 TABLET BY MOUTH THREE TIMES DAILY AS NEEDED FOR DIZZINESS      ondansetron (ZOFRAN-ODT) 4 MG disintegrating tablet DISSOLVE 1 TABLET IN MOUTH EVERY 8 HOURS AS NEEDED FOR NAUSEA AND VOMITING FOR UP TO 7 DAYS       No current facility-administered medications on file prior to visit.         SoHx:     Social History     Tobacco Use    Smoking status: Never    Smokeless tobacco: Never   Substance Use Topics    Alcohol use: Never       Surgical Hx:     Past Surgical History:   Procedure Laterality Date    HYSTERECTOMY  02/2019       Family Hx:     GF  Father  with MI and CABG in his 27s    Review of Systems:   Constitutional: Negative for fevers and chills  Skin: No rash or lesions  Respiratory: Negative for hemoptysis  Cardiovascular: as per HPI  Gastrointestinal: Negative for hematochezia  Musculoskeletal:  No arthritic symptoms  Genitourinary: Negative for hematuria  Neurologic: No headaches  Otherwise 10 point review of systems is negative.      Physical Exam:   BP 122/80 (BP Site: Left arm, Patient Position: Sitting, Cuff Size: Large)   Pulse (!) 59   Temp 98 F (36.7 C)   Ht 1.676 m (5\' 6" )   Wt 69.4 kg (153 lb)   SpO2 98%   BMI 24.69 kg/m   98% SpO2  on              Constitutional:      Alert, cooperative, well developed, well nourished.  No acute distress.   Skin:     Warm and dry to touch.  No apparent rashes or bruising.   Head/Eyes :    Normocephalic.  EOM intact.  Normal conjunctivae and lids.       Neck:   Carotid pulses full and equal bilaterally.  No carotid bruit. JVP to 5 cm.   Lungs:     No tenderness to palpation.  Normal respiratory effort.  Clear to auscultation bilaterally.  No rhonchi, rales, or wheezes.     Cardiac:   LV apical impulse not displaced.  Regular rhythm.   S1 and   S2 normal.  No murmur, rub, or gallop   Abdomen:     Soft, non-tender.  Bowel sounds present.  No bruits.   Extremities:   No clubbing, cyanosis, or edema.   Pulses:   Distal pulses are full and equal bilaterally.     Neurologic:   No gross motor or sensory deficits.  Alert O x 3.   Psychiatric:   Normal mood and affect.     Labs:     No visits with results within 2 Day(s) from this visit.   Latest known visit with results is:   Appointment on 10/02/2019   Component Date Value Ref Range Status    Surgical Pathology 10/02/2019 See Path Report   Final       Radiology:     Not performed    Assessment/Plan:     Vanessa Coleman is a 40 y.o. female who presents for cardiac evaluation of chest pain. Mostly atypical in that it is not exertional. Treadmill stress test for risk  stratification. If normal, suspect inflammatory etiology. BP can be elevated at times and sodium restriction reinforced. May be increased by venlafaxine or caffeine.     Thank you for the consultation. Please feel free to contact me with any questions that may arise regarding this visit.

## 2020-12-07 ENCOUNTER — Ambulatory Visit: Payer: 59

## 2020-12-08 ENCOUNTER — Ambulatory Visit (INDEPENDENT_AMBULATORY_CARE_PROVIDER_SITE_OTHER): Payer: 59

## 2020-12-08 DIAGNOSIS — R079 Chest pain, unspecified: Secondary | ICD-10-CM

## 2020-12-10 ENCOUNTER — Encounter (INDEPENDENT_AMBULATORY_CARE_PROVIDER_SITE_OTHER): Payer: Self-pay

## 2023-04-12 ENCOUNTER — Emergency Department
Admission: EM | Admit: 2023-04-12 | Discharge: 2023-04-12 | Disposition: A | Discharge status: Home / Self Care | Attending: Emergency Medicine | Admitting: Emergency Medicine

## 2023-04-12 DIAGNOSIS — R197 Diarrhea, unspecified: Secondary | ICD-10-CM | POA: Insufficient documentation

## 2023-04-12 DIAGNOSIS — G44209 Tension-type headache, unspecified, not intractable: Secondary | ICD-10-CM | POA: Insufficient documentation

## 2023-04-12 DIAGNOSIS — E860 Dehydration: Secondary | ICD-10-CM | POA: Insufficient documentation

## 2023-04-12 DIAGNOSIS — R112 Nausea with vomiting, unspecified: Secondary | ICD-10-CM | POA: Insufficient documentation

## 2023-04-12 DIAGNOSIS — J019 Acute sinusitis, unspecified: Secondary | ICD-10-CM | POA: Insufficient documentation

## 2023-04-12 HISTORY — DX: Hypothyroidism, unspecified: E03.9

## 2023-04-12 LAB — COMPREHENSIVE METABOLIC PANEL
ALT: 26 U/L (ref <=55)
AST (SGOT): 28 U/L (ref <=41)
Albumin/Globulin Ratio: 1.2 (ref 0.9–2.2)
Albumin: 4.1 g/dL (ref 3.5–5.0)
Alkaline Phosphatase: 110 U/L (ref 37–117)
Anion Gap: 12 (ref 5.0–15.0)
BUN: 12 mg/dL (ref 7–21)
Bilirubin, Total: 0.4 mg/dL (ref 0.2–1.2)
CO2: 23 meq/L (ref 17–29)
Calcium: 9.2 mg/dL (ref 8.5–10.5)
Chloride: 107 meq/L (ref 99–111)
Creatinine: 0.6 mg/dL (ref 0.4–1.0)
GFR: >60 mL/min/{1.73_m2} (ref >=60.0)
Globulin: 3.4 g/dL (ref 2.0–3.6)
Glucose: 102 mg/dL — ABNORMAL HIGH (ref 70–100)
Potassium: 4 meq/L (ref 3.5–5.3)
Protein, Total: 7.5 g/dL (ref 6.0–8.3)
Sodium: 142 meq/L (ref 135–145)

## 2023-04-12 LAB — LAB USE ONLY - CBC WITH DIFFERENTIAL
Absolute Basophils: 0.02 10*3/uL (ref 0.00–0.08)
Absolute Eosinophils: 0.08 10*3/uL (ref 0.00–0.44)
Absolute Immature Granulocytes: 0.01 10*3/uL (ref 0.00–0.07)
Absolute Lymphocytes: 1.25 10*3/uL (ref 0.42–3.22)
Absolute Monocytes: 0.22 10*3/uL (ref 0.21–0.85)
Absolute Neutrophils: 2.46 10*3/uL (ref 1.10–6.33)
Absolute nRBC: 0 10*3/uL (ref <=0.00)
Basophils %: 0.5 %
Eosinophils %: 2 %
Hematocrit: 41.5 % (ref 34.7–43.7)
Hemoglobin: 13.7 g/dL (ref 11.4–14.8)
Immature Granulocytes %: 0.2 %
Lymphocytes %: 30.9 %
MCH: 28.4 pg (ref 25.1–33.5)
MCHC: 33 g/dL (ref 31.5–35.8)
MCV: 85.9 fL (ref 78.0–96.0)
MPV: 9.9 fL (ref 8.9–12.5)
Monocytes %: 5.4 %
Neutrophils %: 61 %
Platelet Count: 194 10*3/uL (ref 142–346)
Preliminary Absolute Neutrophil Count: 2.46 10*3/uL (ref 1.10–6.33)
RBC: 4.83 10*6/uL (ref 3.90–5.10)
RDW: 13 % (ref 11–15)
WBC: 4.04 10*3/uL (ref 3.10–9.50)
nRBC %: 0 /100{WBCs} (ref <=0.0)

## 2023-04-12 LAB — LAB USE ONLY - URINE GRAY CULTURE HOLD TUBE

## 2023-04-12 LAB — URINALYSIS WITH REFLEX TO MICROSCOPIC EXAM - REFLEX TO CULTURE
Urine Bilirubin: NEGATIVE
Urine Glucose: NEGATIVE
Urine Ketones: NEGATIVE mg/dL
Urine Leukocyte Esterase: NEGATIVE
Urine Nitrite: NEGATIVE
Urine Protein: NEGATIVE
Urine Specific Gravity: 1.006 (ref 1.001–1.035)
Urine Urobilinogen: NORMAL mg/dL (ref 0.2–2.0)
Urine pH: 6.5 (ref 5.0–8.0)

## 2023-04-12 LAB — LIPASE: Lipase: 27 U/L (ref 8–78)

## 2023-04-12 LAB — SERUM HCG, QUALITATIVE: hCG Qualitative: NEGATIVE

## 2023-04-12 LAB — HIGH SENSITIVITY TROPONIN-I: hs Troponin: 3.4 ng/L (ref <=14.0)

## 2023-04-12 LAB — MAGNESIUM: Magnesium: 2.2 mg/dL (ref 1.6–2.6)

## 2023-04-12 MED ORDER — DIPHENHYDRAMINE HCL 50 MG/ML IJ SOLN
25.0000 mg | Freq: Once | INTRAMUSCULAR | Status: AC
Start: 2023-04-12 — End: 2023-04-12
  Administered 2023-04-12: 25 mg via INTRAVENOUS
  Filled 2023-04-12: qty 1

## 2023-04-12 MED ORDER — ONDANSETRON 4 MG PO TBDP
4.0000 mg | ORAL_TABLET | Freq: Three times a day (TID) | ORAL | 0 refills | Status: AC | PRN
Start: 2023-04-12 — End: ?

## 2023-04-12 MED ORDER — LOPERAMIDE HCL 2 MG PO CAPS
ORAL_CAPSULE | ORAL | 0 refills | Status: AC
Start: 2023-04-12 — End: ?

## 2023-04-12 MED ORDER — SODIUM CHLORIDE 0.9 % IV BOLUS
1000.0000 mL | Freq: Once | INTRAVENOUS | Status: AC
Start: 2023-04-12 — End: 2023-04-12
  Administered 2023-04-12: 1000 mL via INTRAVENOUS

## 2023-04-12 MED ORDER — MAGNESIUM SULFATE IN D5W 1-5 GM/100ML-% IV SOLN
1.0000 g | Freq: Once | INTRAVENOUS | Status: AC
Start: 2023-04-12 — End: 2023-04-12
  Administered 2023-04-12: 1 g via INTRAVENOUS
  Filled 2023-04-12: qty 100

## 2023-04-12 MED ORDER — CLINDAMYCIN HCL 300 MG PO CAPS
300.0000 mg | ORAL_CAPSULE | Freq: Three times a day (TID) | ORAL | 0 refills | Status: AC
Start: 2023-04-12 — End: 2023-04-19

## 2023-04-12 MED ORDER — DICYCLOMINE HCL 10 MG PO CAPS
20.0000 mg | ORAL_CAPSULE | Freq: Once | ORAL | Status: AC
Start: 2023-04-12 — End: 2023-04-12
  Administered 2023-04-12: 20 mg via ORAL
  Filled 2023-04-12: qty 2

## 2023-04-12 MED ORDER — METOCLOPRAMIDE HCL 5 MG/ML IJ SOLN
10.0000 mg | Freq: Once | INTRAMUSCULAR | Status: AC
Start: 2023-04-12 — End: 2023-04-12
  Administered 2023-04-12: 10 mg via INTRAVENOUS
  Filled 2023-04-12: qty 2

## 2023-04-12 MED ORDER — KETOROLAC TROMETHAMINE 30 MG/ML IJ SOLN
30.0000 mg | Freq: Once | INTRAMUSCULAR | Status: AC
Start: 2023-04-12 — End: 2023-04-12
  Administered 2023-04-12: 30 mg via INTRAVENOUS
  Filled 2023-04-12: qty 1

## 2023-04-12 NOTE — ED Triage Notes (Signed)
 Pt repots with occipital lobe HA. Nausea and vomiting. Pt also has epigastric pain with constipation.

## 2023-04-12 NOTE — ED Provider Notes (Signed)
 EMERGENCY DEPARTMENT NOTE     Patient initially seen and examined at   ED PHYSICIAN ASSIGNED       Date/Time Event User Comments    04/12/23 0800 Physician Assigned Wong Steadham A. Arthor Bernardino LABOR, MD assigned as Attending             HISTORY OF PRESENT ILLNESS       Chief Complaint: Headache and Abdominal Pain (Pt reports HA, Abdominal pain, N/V/D. PT denies blood in stool or vomit. )       History of Present Illness  The patient is a 43 year old with headaches and anemia who presents with headache, abdominal pain, nausea, vomiting, and diarrhea.    She has a history of headaches and anemia and presents with a severe headache that began suddenly last night. The headache is rated 10 out of 10 in intensity, primarily located at the crown of her head, radiating to the front and back. She experienced nausea, dry heaving, and one episode of vomiting water. Tylenol, which usually alleviates her headaches, did not provide relief this time. No sensitivity to light or sound, but she mentions confusion and difficulty focusing.    She reports abdominal pain and diarrhea that began after starting minocycline 100 mg twice daily for a sinus infection last Thursday. She also took probiotics and omeprazole 20 mg twice daily to manage stomach discomfort caused by the antibiotic. Diarrhea started on Monday, which she attributes to the antibiotic. She experienced alternating diarrhea and constipation, with the abdominal pain described as cramping and wave-like, initially severe but currently mild. No current abdominal pain but notes mild tenderness in the lower quadrants when pressed. No blood in stool or urine, and no pain during urination.    Nausea and vomiting are associated with minocycline use. She experienced nausea and dry heaving, with one episode of vomiting water. No persistent vomiting or signs of severe dehydration noted.    She initially started minocycline for a sinus infection, which caused gastrointestinal side  effects. No current sinus pressure or significant symptoms reported.        Independent Historian (other than patient): No  Additional History Provided by Independent Historian:  MEDICAL HISTORY     Past Medical History:  Past Medical History:   Diagnosis Date    Anemia     Arthritis     Depression     Headache     Hypothyroid        Past Surgical History:  Past Surgical History[1]    Social History:  Social History[2]    Family History:  Family History[3]    Outpatient Medication:  Discharge Medication List as of 04/12/2023 10:43 AM        CONTINUE these medications which have NOT CHANGED    Details   fluticasone (FLONASE) 50 MCG/ACT nasal spray as needed, Starting Wed 04/10/2019, Historical Med      ibuprofen (ADVIL) 800 MG tablet Take 800 mg by mouth as needed, Starting Fri 02/22/2019, Historical Med      levothyroxine (SYNTHROID) 50 MCG tablet Take 50 mcg by mouth, Historical Med      Multiple Vitamins-Minerals (Oncovite) Tab Take 1 tablet by mouth, Historical Med      omeprazole (PriLOSEC) 40 MG capsule as needed, Historical Med      venlafaxine (EFFEXOR-XR) 37.5 MG 24 hr capsule Take 37.5 mg by mouth daily, Starting Sat 07/13/2019, Historical Med  REVIEW OF SYSTEMS   Review of Systems See History of Present Illness  PHYSICAL EXAM     ED Triage Vitals [04/12/23 0802]   Encounter Vitals Group      BP (!) 166/106      Systolic BP Percentile       Diastolic BP Percentile       Heart Rate 94      Resp Rate 16      Temp 97.8 F (36.6 C)      Temp src Oral      SpO2 98 %      Weight 69.4 kg      Height       Head Circumference       Peak Flow       Pain Score 10      Pain Loc       Pain Education       Exclude from Growth Chart      Physical Exam   Physical Exam  GENERAL: No acute distress, resting comfortably, supine position.  CHEST: Lungs clear to auscultation bilaterally, no wheezing, rhonchi, or rales.  CARDIOVASCULAR: Regular rate and rhythm, no murmurs.  ABDOMEN: Mild tenderness in lower quadrants  bilaterally, no rebound, guarding, or rigidity. Negative Rovsing's sign, no McBurney's point tenderness, negative Murphy's sign.  EXTREMITIES: No pitting edema in lower extremities.  NEUROLOGICAL: Moves all four extremities, 5/5 strength. No facial droop, tongue midline, no dysphasia, EOMI, PERRLA. No ataxia with ambulation.  SKIN: Skin nondiaphoretic.        MEDICAL DECISION MAKING     PRIMARY PROBLEM LIST      Acute illness/injury DIAGNOSIS:Headache, diarrhea, sinusitis     Differential Diagnosis: gastroenteritis, food poisoning, antibiotic associated nausea, vomiting, and diarrhea dehydration, Tension Headache, Migraine, Atypical Migraine, Hemicrania, Cluster Headache, Occipital Neuralgia, Otitis Media, Glaucoma, ICH, Anurism, sinusitis      DISCUSSION      Assessment & Plan  Headache    She experienced an acute severe headache with a sudden onset last night, rated at 10/10 intensity. The pain is located at the crown, radiating to the front and back, and is associated with nausea, dry heaving, and hypertension. Dehydration from diarrhea likely exacerbated the headache. Examination showed no photophobia, neck pain, meningitis, or intracranial pathology. Hydration and IV medications have provided some relief. Administer IV fluids for rehydration and a headache cocktail via IV.    Diarrhea    Diarrhea began two days ago, likely due to minocycline use. There is no fever, bloody stools, or signs of infection, but symptoms alternate with constipation. Dehydration is suspected to contribute to the headache. Discussed options of continuing minocycline with supportive medications or switching to clindamycin  due to gastrointestinal side effects. Discontinue minocycline and prescribe clindamycin  as an alternative antibiotic. Prescribe Imodium  for diarrhea management and Zofran  for nausea management.    Sinusitis    A sinus infection was diagnosed last Thursday and initially treated with minocycline. Patient having symptoms  greater than 10 days.  She reports sinus pressure but no significant improvement. Due to gastrointestinal side effects from minocycline, the decision was made to switch antibiotics. Clindamycin  is chosen as an alternative despite potential gastrointestinal side effects, as it is the best option given her allergies. Prescribe clindamycin  for sinusitis treatment.                             External Records Reviewed?: Physician Office Records and Inpatient Records  Additional Notes                  Vital Signs: Reviewed the patient's vital signs.   Nursing Notes: Reviewed and utilized available nursing notes.  Medical Records Reviewed: Reviewed available past medical records.  Counseling: The emergency provider has spoken with the patient and discussed today's findings, in addition to providing specific details for the plan of care.  Questions are answered and there is agreement with the plan.          MIPS DOCUMENTATION                RADIOLOGY IMAGING STUDIES      No orders to display       EMERGENCY DEPT. MEDICATIONS      ED Medication Orders (From admission, onward)      Start Ordered     Status Ordering Provider    04/12/23 0830 04/12/23 0829  ketorolac  (TORADOL ) injection 30 mg  Once        Route: Intravenous  Ordered Dose: 30 mg       Last MAR action: Given Kyrin Garn A    04/12/23 0830 04/12/23 0829  metoclopramide  (REGLAN ) injection 10 mg  Once        Route: Intravenous  Ordered Dose: 10 mg       Last MAR action: Given Kaedyn Polivka A    04/12/23 0830 04/12/23 0829  diphenhydrAMINE  (BENADRYL ) injection 25 mg  Once        Route: Intravenous  Ordered Dose: 25 mg       Last MAR action: Given Reznor Ferrando A    04/12/23 0830 04/12/23 0829  magnesium  sulfate 1g in dextrose  5% 100mL IVPB (premix)  Once        Route: Intravenous  Ordered Dose: 1 g       Last MAR action: Stopped Amen Dargis A    04/12/23 0830 04/12/23 0829  dicyclomine  (BENTYL ) capsule 20 mg  Once        Route: Oral  Ordered Dose: 20 mg       Last  MAR action: Given Eleonora Peeler A    04/12/23 0801 04/12/23 0800  sodium chloride  0.9 % bolus 1,000 mL  Once        Route: Intravenous  Ordered Dose: 1,000 mL       Last MAR action: Stopped Stephfon Bovey A            LABORATORY RESULTS    Ordered and independently interpreted AVAILABLE laboratory tests.   Results       Procedure Component Value Units Date/Time    Urinalysis with Reflex to Microscopic Exam and Culture [[id number]]  (Abnormal) Collected: 04/12/23 1017    Specimen: Urine, Clean Catch Updated: 04/12/23 1027     Urine Color Colorless     Urine Clarity Clear     Urine Specific Gravity 1.006     Urine pH 6.5     Urine Leukocyte Esterase Negative     Urine Nitrite Negative     Urine Protein Negative     Urine Glucose Negative     Urine Ketones Negative mg/dL      Urine Urobilinogen Normal mg/dL      Urine Bilirubin Negative     Urine Blood Trace     RBC, UA 0-2 /hpf      Urine WBC 0-5 /hpf      Urine Squamous Epithelial Cells 0-5 /hpf     Serum Beta  HCG Qualitative [[id number]]  (Normal) Collected: 04/12/23 0817    Specimen: Blood, Venous Updated: 04/12/23 0930     hCG Qualitative Negative    Comprehensive Metabolic Panel [[id number]]  (Abnormal) Collected: 04/12/23 0817    Specimen: Blood, Venous Updated: 04/12/23 0852     Glucose 102 mg/dL      BUN 12 mg/dL      Creatinine 0.6 mg/dL      Sodium 857 mEq/L      Potassium 4.0 mEq/L      Chloride 107 mEq/L      CO2 23 mEq/L      Calcium 9.2 mg/dL      Anion Gap 87.9     GFR >60.0 mL/min/1.73 m2      AST (SGOT) 28 U/L      ALT 26 U/L      Alkaline Phosphatase 110 U/L      Albumin 4.1 g/dL      Protein, Total 7.5 g/dL      Globulin 3.4 g/dL      Albumin/Globulin Ratio 1.2     Bilirubin, Total 0.4 mg/dL     Lipase [[id number]]  (Normal) Collected: 04/12/23 0817    Specimen: Blood, Venous Updated: 04/12/23 0852     Lipase 27 U/L     Magnesium  [[id number]]  (Normal) Collected: 04/12/23 0817    Specimen: Blood, Venous Updated: 04/12/23 0852     Magnesium  2.2 mg/dL      High Sensitivity Troponin-I [[id number]]  (Normal) Collected: 04/12/23 0817    Specimen: Blood, Venous Updated: 04/12/23 0852     hs Troponin 3.4 ng/L     CBC with Differential (Order) [[id number]] Collected: 04/12/23 0817    Specimen: Blood, Venous Updated: 04/12/23 9178    Narrative:      The following orders were created for panel order CBC with Differential (Order).  Procedure                               Abnormality         Status                     ---------                               -----------         ------                     CBC with Differential (C.SABRASABRA[[id number]]                      Final result                 Please view results for these tests on the individual orders.    CBC with Differential (Component) [[id number]] Collected: 04/12/23 0817    Specimen: Blood, Venous Updated: 04/12/23 0821     WBC 4.04 x10 3/uL      Hemoglobin 13.7 g/dL      Hematocrit 58.4 %      Platelet Count 194 x10 3/uL      MPV 9.9 fL      RBC 4.83 x10 6/uL      MCV 85.9 fL      MCH 28.4 pg      MCHC 33.0 g/dL      RDW 13 %  nRBC % 0.0 /100 WBC      Absolute nRBC 0.00 x10 3/uL      Preliminary Absolute Neutrophil Count 2.46 x10 3/uL      Neutrophils % 61.0 %      Lymphocytes % 30.9 %      Monocytes % 5.4 %      Eosinophils % 2.0 %      Basophils % 0.5 %      Immature Granulocytes % 0.2 %      Absolute Neutrophils 2.46 x10 3/uL      Absolute Lymphocytes 1.25 x10 3/uL      Absolute Monocytes 0.22 x10 3/uL      Absolute Eosinophils 0.08 x10 3/uL      Absolute Basophils 0.02 x10 3/uL      Absolute Immature Granulocytes 0.01 x10 3/uL               CRITICAL CARE/PROCEDURES    Procedures    DIAGNOSIS      Diagnosis:  Final diagnoses:   Acute non intractable tension-type headache   Dehydration   Nausea vomiting and diarrhea   Subacute sinusitis, unspecified location       Disposition:  ED Disposition       ED Disposition   Discharge    Condition   --    Date/Time   Wed Apr 12, 2023 10:40 AM    Comment   Johnanna Birmingham discharge  to home/self care.    Condition at disposition: Stable                 Prescriptions:  Discharge Medication List as of 04/12/2023 10:43 AM        START taking these medications    Details   clindamycin  (CLEOCIN ) 300 MG capsule Take 1 capsule (300 mg) by mouth 3 (three) times daily for 7 days, Starting Wed 04/12/2023, Until Wed 04/19/2023, E-Rx      loperamide  (IMODIUM ) 2 MG capsule 4 mg ORALLY followed by 2 mg after each loose stool up to a maximum of 16 mg/day, E-Rx      ondansetron  (ZOFRAN -ODT) 4 MG disintegrating tablet Take 1 tablet (4 mg) by mouth every 8 (eight) hours as needed for Nausea, Starting Wed 04/12/2023, E-Rx           CONTINUE these medications which have NOT CHANGED    Details   fluticasone (FLONASE) 50 MCG/ACT nasal spray as needed, Starting Wed 04/10/2019, Historical Med      ibuprofen (ADVIL) 800 MG tablet Take 800 mg by mouth as needed, Starting Fri 02/22/2019, Historical Med      levothyroxine (SYNTHROID) 50 MCG tablet Take 50 mcg by mouth, Historical Med      Multiple Vitamins-Minerals (Oncovite) Tab Take 1 tablet by mouth, Historical Med      omeprazole (PriLOSEC) 40 MG capsule as needed, Historical Med      venlafaxine (EFFEXOR-XR) 37.5 MG 24 hr capsule Take 37.5 mg by mouth daily, Starting Sat 07/13/2019, Historical Med                 This note was generated by the [employer] EMR system/ Dragon speech recognition and may contain inherent errors or omissions not intended by the user. Grammatical errors, random word insertions, deletions and pronoun errors  are occasional consequences of this technology due to software limitations. Not all errors are caught or corrected. If there are questions or concerns about the content of this note or information contained within the body of  this dictation they should be addressed directly with the author for clarification.         [1]   Past Surgical History:  Procedure Laterality Date    HYSTERECTOMY  02/2019   [2]   Social History  Socioeconomic History     Marital status: Married   Tobacco Use    Smoking status: Never    Smokeless tobacco: Never   Vaping Use    Vaping status: Never Used   Substance and Sexual Activity    Alcohol use: Never    Drug use: Never    Sexual activity: Never   [3]   Family History  Problem Relation Name Age of Onset    Heart disease Father      Heart disease Paternal Grandmother      Heart attack Paternal Grandfather          Arthor Bernardino LABOR, MD  04/12/23 2236

## 2023-12-26 ENCOUNTER — Other Ambulatory Visit (INDEPENDENT_AMBULATORY_CARE_PROVIDER_SITE_OTHER): Payer: Self-pay | Admitting: Urology
